# Patient Record
Sex: Female | Born: 1995 | Race: Black or African American | Hispanic: No | Marital: Single | Smoking: Current every day smoker
Health system: Southern US, Community
[De-identification: ages and names within clinical notes are randomized; demographics above are authoritative.]

## PROBLEM LIST (undated history)

## (undated) DIAGNOSIS — J45909 Unspecified asthma, uncomplicated: Secondary | ICD-10-CM

## (undated) DIAGNOSIS — R51 Headache: Secondary | ICD-10-CM

## (undated) DIAGNOSIS — L732 Hidradenitis suppurativa: Secondary | ICD-10-CM

---
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
## 2015-01-08 ENCOUNTER — Emergency Department (INDEPENDENT_AMBULATORY_CARE_PROVIDER_SITE_OTHER): Payer: Medicaid Other

## 2015-01-08 ENCOUNTER — Emergency Department (INDEPENDENT_AMBULATORY_CARE_PROVIDER_SITE_OTHER)
Admission: EM | Admit: 2015-01-08 | Discharge: 2015-01-08 | Disposition: A | Discharge status: Home / Self Care | Payer: Medicaid Other | Source: Home / Self Care | Attending: Family Medicine | Admitting: Family Medicine

## 2015-01-08 DIAGNOSIS — R52 Pain, unspecified: Secondary | ICD-10-CM

## 2015-01-08 DIAGNOSIS — S62609A Fracture of unspecified phalanx of unspecified finger, initial encounter for closed fracture: Secondary | ICD-10-CM

## 2015-01-08 NOTE — Discharge Instructions (Signed)

## 2015-01-08 NOTE — ED Provider Notes (Signed)
[name]: [id number]     Arrival date & time 01/08/15  2683 History   First MD Initiated Contact with Patient 01/08/15 1026     Chief Complaint  Patient presents with  . Hand Pain   (Consider location/radiation/quality/duration/timing/severity/associated sxs/prior Treatment) Patient is a 19 y.o. female presenting with hand pain. The history is provided by the patient. No language interpreter was used.  Hand Pain This is a new problem. The current episode started more than 1 week ago. The problem occurs constantly. The problem has not changed since onset.Nothing aggravates the symptoms. Nothing relieves the symptoms. She has tried nothing for the symptoms. The treatment provided no relief.  Pt injured his finger 2 weeks ago.   Pt complains of continued swelling and pain  Past Medical History  Diagnosis Date  . Asthma Jan 2013    no occurences since then  . Headache(784.0)     associated with menses  . 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 Incision and drainage abscess Bilateral 05/10/2013    Procedure: INCISION AND DRAINAGE of bilateral axillary hydradenitis;  Surgeon: Leighton Ruff, MD;  Location: [facility];  Service: General;  Laterality: Bilateral;   Family History  Problem Relation Age of Onset  . Cancer Maternal Aunt     Breast. diagnosed young  . Cancer Maternal Grandmother     Breast  . Lupus Brother    History  Substance Use Topics  . Smoking status: Former Smoker    Quit date: 05/02/2013  . Smokeless tobacco: Not on file     Comment: mom smokes  . Alcohol Use: No   OB History    No data available     Review of Systems  Musculoskeletal: Positive for joint swelling.  All other systems reviewed and are negative.   Allergies  Ibuprofen  Home Medications   Prior to Admission medications   Medication Sig Start Date End Date Taking? Authorizing Provider  acetaminophen (TYLENOL) 500 MG tablet Take 1,000 mg by mouth every 6  (six) hours as needed. For fever    Historical Provider, MD  albuterol (PROVENTIL HFA;VENTOLIN HFA) 108 (90 BASE) MCG/ACT inhaler Inhale 2 puffs into the lungs every 6 (six) hours as needed for wheezing.    Historical Provider, MD  ondansetron (ZOFRAN ODT) 4 MG disintegrating tablet Take 1 tablet (4 mg total) by mouth every 8 (eight) hours as needed for nausea or vomiting. 09/23/14   Leonard Schwartz, MD  traMADol (ULTRAM) 50 MG tablet Take 1 tablet (50 mg total) by mouth every 6 (six) hours as needed. Patient taking differently: Take 50 mg by mouth every 6 (six) hours as needed for moderate pain.  02/21/14   Domenic Moras, PA-C   BP 112/78 mmHg  Pulse 65  Temp(Src) 98.5 F (36.9 C) (Oral)  Resp 16  SpO2 100%  LMP 12/25/2014 Physical Exam  Constitutional: She is oriented to person, place, and time. She appears well-developed.  Neurological: She is alert and oriented to person, place, and time. She has normal reflexes.  Skin: Skin is warm.  Psychiatric: She has a normal mood and affect.    ED Course  Procedures (including critical care time) Labs Review Labs Reviewed - No data to display  Imaging Review Dg Finger Middle Right  01/08/2015   CLINICAL DATA:  Right middle finger injury while playing ball. Swelling and pain. Initial encounter.  EXAM: RIGHT MIDDLE FINGER 2+V  COMPARISON:  None.  FINDINGS: A nondisplaced fracture  is suspected on the lateral radiograph involving the volar base of the long finger distal phalanx without definite intra-articular extension. There is no other evidence of acute fracture. There is no dislocation. Joint space widths are preserved. Soft tissue swelling is noted diffusely in the long finger. No radiopaque foreign body.  IMPRESSION: Suspected nondisplaced fracture of the long finger distal phalanx.   Electronically Signed   By: Logan Bores   On: 01/08/2015 11:25     MDM Pt placed in a splint,  I advised follow up with Dr. Marga Hoots for evaluation   1. Fracture,  finger, closed, initial encounter   2. Pain    Splint See Dr. Caralyn Guile for recheck in 1 week    Fransico Meadow, PA-C 01/08/15 1139

## 2015-01-08 NOTE — ED Notes (Signed)
C/o right middle finger swelling States finger is painful States she was playing basketball when her finger was jab against another person hand Patient did wrap finger in a splint as tx

## 2015-01-24 ENCOUNTER — Encounter (HOSPITAL_COMMUNITY): Payer: Self-pay

## 2015-01-24 ENCOUNTER — Emergency Department (INDEPENDENT_AMBULATORY_CARE_PROVIDER_SITE_OTHER)
Admission: EM | Admit: 2015-01-24 | Discharge: 2015-01-24 | Disposition: A | Discharge status: Home / Self Care | Payer: Medicaid Other | Source: Home / Self Care | Attending: Family Medicine | Admitting: Family Medicine

## 2015-01-24 ENCOUNTER — Ambulatory Visit (HOSPITAL_COMMUNITY): Payer: Medicaid Other | Attending: Family Medicine

## 2015-01-24 DIAGNOSIS — S43002A Unspecified subluxation of left shoulder joint, initial encounter: Secondary | ICD-10-CM | POA: Diagnosis not present

## 2015-01-24 DIAGNOSIS — M25512 Pain in left shoulder: Secondary | ICD-10-CM | POA: Insufficient documentation

## 2015-01-24 DIAGNOSIS — W19XXXA Unspecified fall, initial encounter: Secondary | ICD-10-CM | POA: Diagnosis not present

## 2015-01-24 MED ORDER — TRAMADOL HCL 50 MG PO TABS: 50.0000 mg | ORAL_TABLET | Freq: Four times a day (QID) | ORAL | Status: DC | PRN
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
## 2015-01-24 NOTE — Discharge Instructions (Signed)
Ice pack and wear sling for comfort , see orthopedist next week if further problems.

## 2015-03-01 ENCOUNTER — Emergency Department (HOSPITAL_COMMUNITY)
Admission: EM | Admit: 2015-03-01 | Discharge: 2015-03-02 | Disposition: A | Discharge status: Home / Self Care | Payer: Medicaid Other | Attending: Emergency Medicine | Admitting: Emergency Medicine

## 2015-03-01 ENCOUNTER — Emergency Department (HOSPITAL_COMMUNITY): Payer: Medicaid Other

## 2015-03-01 ENCOUNTER — Encounter (HOSPITAL_COMMUNITY): Payer: Self-pay | Admitting: Emergency Medicine

## 2015-03-01 DIAGNOSIS — Z87891 Personal history of nicotine dependence: Secondary | ICD-10-CM | POA: Diagnosis not present

## 2015-03-01 DIAGNOSIS — Z872 Personal history of diseases of the skin and subcutaneous tissue: Secondary | ICD-10-CM | POA: Diagnosis not present

## 2015-03-01 DIAGNOSIS — S8252XA Displaced fracture of medial malleolus of left tibia, initial encounter for closed fracture: Secondary | ICD-10-CM | POA: Diagnosis not present

## 2015-03-01 DIAGNOSIS — Y9289 Other specified places as the place of occurrence of the external cause: Secondary | ICD-10-CM | POA: Diagnosis not present

## 2015-03-01 DIAGNOSIS — J45909 Unspecified asthma, uncomplicated: Secondary | ICD-10-CM | POA: Diagnosis not present

## 2015-03-01 DIAGNOSIS — Y9389 Activity, other specified: Secondary | ICD-10-CM | POA: Insufficient documentation

## 2015-03-01 DIAGNOSIS — Y998 Other external cause status: Secondary | ICD-10-CM | POA: Insufficient documentation

## 2015-03-01 DIAGNOSIS — S99912A Unspecified injury of left ankle, initial encounter: Secondary | ICD-10-CM | POA: Diagnosis present

## 2015-03-01 DIAGNOSIS — Z7951 Long term (current) use of inhaled steroids: Secondary | ICD-10-CM | POA: Diagnosis not present

## 2015-03-01 DIAGNOSIS — X58XXXA Exposure to other specified factors, initial encounter: Secondary | ICD-10-CM | POA: Diagnosis not present

## 2015-03-01 MED ORDER — KETOROLAC TROMETHAMINE 60 MG/2ML IM SOLN
60.0000 mg | Freq: Once | INTRAMUSCULAR | Status: AC
  Administered 2015-03-01: 60 mg via INTRAMUSCULAR
  Filled 2015-03-01: qty 2

## 2015-03-01 NOTE — ED Notes (Signed)
Pt reports twisting L ankle 3 weeks ago. Pt c.o continued pain. Ankle brace in place. PMS intact.

## 2015-03-01 NOTE — ED Provider Notes (Signed)
[name]: [id number]     Arrival date & time 03/01/15  2140 History  This chart was scribed for non-physician practitioner, Al Corpus, PA-C,working with Wandra Arthurs, MD, by Marlowe Kays, ED Scribe. This patient was seen in room TR05C/TR05C and the patient's care was started at 11:15 PM.  Chief Complaint  Patient presents with  . Ankle Pain   The history is provided by medical records and the patient. No language interpreter was used.    HPI Comments:  Jessica Fitzpatrick is a 19 y.o. female who presents to the Emergency Department complaining of moderate left ankle pain that began approximately three weeks ago secondary to twisting it. She states the pain has decreased since the injury but is still present. She has been wearing an ankle brace with some relief of the pain. She denies modifying factors of the pain. She states she has been busy and has not been able to stay off of the ankle to allow it to heal. Denies fever, chills, numbness, tingling or weakness of the left ankle or foot.  Past Medical History  Diagnosis Date  . Asthma Jan 2013    no occurences since then  . Headache(784.0)     associated with menses  . 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 Incision and drainage abscess Bilateral 05/10/2013    Procedure: INCISION AND DRAINAGE of bilateral axillary hydradenitis;  Surgeon: Leighton Ruff, MD;  Location: [facility];  Service: General;  Laterality: Bilateral;   Family History  Problem Relation Age of Onset  . Cancer Maternal Aunt     Breast. diagnosed young  . Cancer Maternal Grandmother     Breast  . Lupus Brother    History  Substance Use Topics  . Smoking status: Former Smoker    Quit date: 05/02/2013  . Smokeless tobacco: Not on file     Comment: mom smokes  . Alcohol Use: No   OB History    No data available     Review of Systems  Musculoskeletal: Positive for arthralgias. Negative for joint swelling.  Skin:  Negative for color change and wound.  Neurological: Negative for weakness and numbness.    Allergies  Ibuprofen  Home Medications   Prior to Admission medications   Medication Sig Start Date End Date Taking? Authorizing Provider  acetaminophen (TYLENOL) 500 MG tablet Take 1,000 mg by mouth every 6 (six) hours as needed. For fever    Historical Provider, MD  albuterol (PROVENTIL HFA;VENTOLIN HFA) 108 (90 BASE) MCG/ACT inhaler Inhale 2 puffs into the lungs every 6 (six) hours as needed for wheezing.    Historical Provider, MD  ondansetron (ZOFRAN ODT) 4 MG disintegrating tablet Take 1 tablet (4 mg total) by mouth every 8 (eight) hours as needed for nausea or vomiting. 09/23/14   Leonard Schwartz, MD  traMADol (ULTRAM) 50 MG tablet Take 1 tablet (50 mg total) by mouth every 6 (six) hours as needed. 01/24/15   Billy Fischer, MD   Triage Vitals: BP 116/79 mmHg  Pulse 77  Temp(Src) 98.4 F (36.9 C) (Oral)  Resp 18  SpO2 100%  LMP 02/22/2015 Physical Exam  Constitutional: She appears well-developed and well-nourished. No distress.  HENT:  Head: Normocephalic and atraumatic.  Eyes: Conjunctivae are normal. Right eye exhibits no discharge. Left eye exhibits no discharge.  Cardiovascular:  Pulses:      Dorsalis pedis pulses are 2+ on the right side, and 2+ on the left side.  Posterior tibial pulses are 2+ on the right side, and 2+ on the left side.  Pulmonary/Chest: Effort normal. No respiratory distress.  Musculoskeletal: She exhibits tenderness.  Achilles tendon intact. Normal Thompson's sign. No proximal fibular head tenderness. No medial or malleolar tenderness. No erythema, bruising or wounds. Anterior left joint line tenderness.  Neurological: She is alert. Coordination normal.  5/5 strength in LLE. Sensations intact.  Skin: She is not diaphoretic.  Psychiatric: She has a normal mood and affect. Her behavior is normal.  Nursing note and vitals reviewed.   ED Course  Procedures  (including critical care time) DIAGNOSTIC STUDIES: Oxygen Saturation is 100% on RA, normal by my interpretation.   COORDINATION OF CARE: 11:19 PM- Will X-Ray left ankle and order Toradol. Pt verbalizes understanding and agrees to plan.  Medications  ketorolac (TORADOL) injection 60 mg (60 mg Intramuscular Given 03/01/15 2349)    Labs Review Labs Reviewed - No data to display  Imaging Review Dg Ankle Complete Left  03/02/2015   CLINICAL DATA:  19 year old female with twisting injury playing basketball 3 weeks ago. Continued lateral and anterior pain. Initial encounter.  EXAM: LEFT ANKLE COMPLETE - 3+ VIEW  COMPARISON:  None.  FINDINGS: Anterior soft tissue swelling. No joint effusion is evident. Mortise joint alignment preserved. Talar dome intact. Calcaneus intact. Mild cortical irregularity at the tip of the medial malleolus. Otherwise no acute fracture or dislocation identified. Bone mineralization is within normal limits.  IMPRESSION: 1. Subacute versus chronic avulsion injury from the medial malleolus. 2. No other osseous abnormality identified at the left ankle. Anterior soft tissue swelling.   Electronically Signed   By: Genevie Ann M.D.   On: 03/02/2015 00:05     EKG Interpretation None      MDM   Final diagnoses:  Medial malleolar fracture, left, closed, initial encounter   Subacute versus chronic avulsion injury to medial malleolus. Neurovascularly intact. Patient is 3 weeks out from injury. Patient placed in cam discussed Rice as well as ibuprofen use and follow-up with orthopedics.  Discussed return precautions with patient. Discussed all results and patient verbalizes understanding and agrees with plan.  I personally performed the services described in this documentation, which was scribed in my presence. The recorded information has been reviewed and is accurate.    Al Corpus, PA-C 03/02/15 0025  Wandra Arthurs, MD 03/03/15 1430

## 2015-03-01 NOTE — ED Notes (Signed)
Patient transported to X-ray 

## 2015-03-02 NOTE — Progress Notes (Signed)
Orthopedic Tech Progress Note Patient Details:  Jessica Fitzpatrick [birth year] [id number]  Ortho Devices Type of Ortho Device: CAM walker Ortho Device/Splint Interventions: Application   [name], [name] 03/02/2015, 1:55 AM

## 2015-03-02 NOTE — Discharge Instructions (Signed)
Return to the emergency room with worsening of symptoms, new symptoms or with symptoms that are concerning, especially fevers, redness, swelling, numbness, tingling, weakness, blue toes. Make follow-up appointment with orthopedics. Where Cam Walker. RICE: Rest, Ice (three cycles of 20 mins on, 30mins off at least twice a day), compression/brace, elevation. Heating pad works well for back pain. Ibuprofen 400mg  (2 tablets 200mg ) every 5-6 hours for 3-5 days. Read below information and follow recommendations. Ankle Fracture A fracture is a break in a bone. The ankle joint is made up of three bones. These include the lower (distal)sections of your lower leg bones, called the tibia and fibula, along with a bone in your foot, called the talus. Depending on how bad the break is and if more than one ankle joint bone is broken, a cast or splint is used to protect and keep your injured bone from moving while it heals. Sometimes, surgery is required to help the fracture heal properly.  There are two general types of fractures:  Stable fracture. This includes a single fracture line through one bone, with no injury to ankle ligaments. A fracture of the talus that does not have any displacement (movement of the bone on either side of the fracture line) is also stable.  Unstable fracture. This includes more than one fracture line through one or more bones in the ankle joint. It also includes fractures that have displacement of the bone on either side of the fracture line. CAUSES  A direct blow to the ankle.   Quickly and severely twisting your ankle.  Trauma, such as a car accident or falling from a significant height. RISK FACTORS You may be at a higher risk of ankle fracture if:  You have certain medical conditions.  You are involved in high-impact sports.  You are involved in a high-impact car accident. SIGNS AND SYMPTOMS   Tender and swollen ankle.  Bruising around the injured ankle.  Pain on  movement of the ankle.  Difficulty walking or putting weight on the ankle.  A cold foot below the site of the ankle injury. This can occur if the blood vessels passing through your injured ankle were also damaged.  Numbness in the foot below the site of the ankle injury. DIAGNOSIS  An ankle fracture is usually diagnosed with a physical exam and X-rays. A CT scan may also be required for complex fractures. TREATMENT  Stable fractures are treated with a cast or splint and using crutches to avoid putting weight on your injured ankle. This is followed by an ankle strengthening program. Some patients require a special type of cast, depending on other medical problems they may have. Unstable fractures require surgery to ensure the bones heal properly. Your health care provider will tell you what type of fracture you have and the best treatment for your condition. HOME CARE INSTRUCTIONS   Review correct crutch use with your health care provider and use your crutches as directed. Safe use of crutches is extremely important. Misuse of crutches can cause you to fall or cause injury to nerves in your hands or armpits.  Do not put weight or pressure on the injured ankle until directed by your health care provider.  To lessen the swelling, keep the injured leg elevated while sitting or lying down.  Apply ice to the injured area:  Put ice in a plastic bag.  Place a towel between your cast and the bag.  Leave the ice on for 20 minutes, 2-3 times a day.  If you have a plaster or fiberglass cast:  Do not try to scratch the skin under the cast with any objects. This can increase your risk of skin infection.  Check the skin around the cast every day. You may put lotion on any red or sore areas.  Keep your cast dry and clean.  If you have a plaster splint:  Wear the splint as directed.  You may loosen the elastic around the splint if your toes become numb, tingle, or turn cold or blue.  Do not  put pressure on any part of your cast or splint; it may break. Rest your cast only on a pillow the first 24 hours until it is fully hardened.  Your cast or splint can be protected during bathing with a plastic bag sealed to your skin with medical tape. Do not lower the cast or splint into water.  Take medicines as directed by your health care provider. Only take over-the-counter or prescription medicines for pain, discomfort, or fever as directed by your health care provider.  Do not drive a vehicle until your health care provider specifically tells you it is safe to do so.  If your health care provider has given you a follow-up appointment, it is very important to keep that appointment. Not keeping the appointment could result in a chronic or permanent injury, pain, and disability. If you have any problem keeping the appointment, call the facility for assistance. SEEK MEDICAL CARE IF: You develop increased swelling or discomfort. SEEK IMMEDIATE MEDICAL CARE IF:   Your cast gets damaged or breaks.  You have continued severe pain.  You develop new pain or swelling after the cast was put on.  Your skin or toenails below the injury turn blue or gray.  Your skin or toenails below the injury feel cold, numb, or have loss of sensitivity to touch.  There is a bad smell or pus draining from under the cast. MAKE SURE YOU:   Understand these instructions.  Will watch your condition.  Will get help right away if you are not doing well or get worse. Document Released: 08/27/2000 Document Revised: 09/04/2013 Document Reviewed: 03/29/2013 [facility] Patient Information 2015 [place], [place]. This information is not intended to replace advice given to you by your health care provider. Make sure you discuss any questions you have with your health care provider.

## 2015-03-30 ENCOUNTER — Emergency Department (HOSPITAL_COMMUNITY): Payer: Medicaid Other

## 2015-03-30 ENCOUNTER — Encounter (HOSPITAL_COMMUNITY): Payer: Self-pay | Admitting: *Deleted

## 2015-03-30 ENCOUNTER — Emergency Department (HOSPITAL_COMMUNITY)
Admission: EM | Admit: 2015-03-30 | Discharge: 2015-03-30 | Disposition: A | Discharge status: Home / Self Care | Payer: Medicaid Other | Attending: Emergency Medicine | Admitting: Emergency Medicine

## 2015-03-30 DIAGNOSIS — Z8781 Personal history of (healed) traumatic fracture: Secondary | ICD-10-CM | POA: Insufficient documentation

## 2015-03-30 DIAGNOSIS — X58XXXD Exposure to other specified factors, subsequent encounter: Secondary | ICD-10-CM | POA: Insufficient documentation

## 2015-03-30 DIAGNOSIS — M25572 Pain in left ankle and joints of left foot: Secondary | ICD-10-CM | POA: Diagnosis present

## 2015-03-30 DIAGNOSIS — Z872 Personal history of diseases of the skin and subcutaneous tissue: Secondary | ICD-10-CM | POA: Diagnosis not present

## 2015-03-30 DIAGNOSIS — S93402D Sprain of unspecified ligament of left ankle, subsequent encounter: Secondary | ICD-10-CM | POA: Diagnosis not present

## 2015-03-30 DIAGNOSIS — J45909 Unspecified asthma, uncomplicated: Secondary | ICD-10-CM | POA: Diagnosis not present

## 2015-03-30 DIAGNOSIS — Z87891 Personal history of nicotine dependence: Secondary | ICD-10-CM | POA: Diagnosis not present

## 2015-03-30 NOTE — ED Notes (Signed)
Patient is alert and orientedx4.  Patient was explained discharge instructions and they understood them with no questions.  The patient's step mother, Tessie Fass is taking the patient home.

## 2015-03-30 NOTE — Discharge Instructions (Signed)
Ankle Sprain °An ankle sprain is an injury to the strong, fibrous tissues (ligaments) that hold the bones of your ankle joint together.  °CAUSES °An ankle sprain is usually caused by a fall or by twisting your ankle. Ankle sprains most commonly occur when you step on the outer edge of your foot, and your ankle turns inward. People who participate in sports are more prone to these types of injuries.  °SYMPTOMS  °· Pain in your ankle. The pain may be present at rest or only when you are trying to stand or walk. °· Swelling. °· Bruising. Bruising may develop immediately or within 1 to 2 days after your injury. °· Difficulty standing or walking, particularly when turning corners or changing directions. °DIAGNOSIS  °Your caregiver will ask you details about your injury and perform a physical exam of your ankle to determine if you have an ankle sprain. During the physical exam, your caregiver will press on and apply pressure to specific areas of your foot and ankle. Your caregiver will try to move your ankle in certain ways. An X-ray exam may be done to be sure a bone was not broken or a ligament did not separate from one of the bones in your ankle (avulsion fracture).  °TREATMENT  °Certain types of braces can help stabilize your ankle. Your caregiver can make a recommendation for this. Your caregiver may recommend the use of medicine for pain. If your sprain is severe, your caregiver may refer you to a surgeon who helps to restore function to parts of your skeletal system (orthopedist) or a physical therapist. °HOME CARE INSTRUCTIONS  °· Apply ice to your injury for 1-2 days or as directed by your caregiver. Applying ice helps to reduce inflammation and pain. °· Put ice in a plastic bag. °· Place a towel between your skin and the bag. °· Leave the ice on for 15-20 minutes at a time, every 2 hours while you are awake. °· Only take over-the-counter or prescription medicines for pain, discomfort, or fever as directed by  your caregiver. °· Elevate your injured ankle above the level of your heart as much as possible for 2-3 days. °· If your caregiver recommends crutches, use them as instructed. Gradually put weight on the affected ankle. Continue to use crutches or a cane until you can walk without feeling pain in your ankle. °· If you have a plaster splint, wear the splint as directed by your caregiver. Do not rest it on anything harder than a pillow for the first 24 hours. Do not put weight on it. Do not get it wet. You may take it off to take a shower or bath. °· You may have been given an elastic bandage to wear around your ankle to provide support. If the elastic bandage is too tight (you have numbness or tingling in your foot or your foot becomes cold and blue), adjust the bandage to make it comfortable. °· If you have an air splint, you may blow more air into it or let air out to make it more comfortable. You may take your splint off at night and before taking a shower or bath. Wiggle your toes in the splint several times per day to decrease swelling. °SEEK MEDICAL CARE IF:  °· You have rapidly increasing bruising or swelling. °· Your toes feel extremely cold or you lose feeling in your foot. °· Your pain is not relieved with medicine. °SEEK IMMEDIATE MEDICAL CARE IF: °· Your toes are numb or blue. °·   You have severe pain that is increasing. °MAKE SURE YOU:  °· Understand these instructions. °· Will watch your condition. °· Will get help right away if you are not doing well or get worse. °Document Released: 08/30/2005 Document Revised: 05/24/2012 Document Reviewed: 09/11/2011 °[employer]® Patient Information ©2015 [employer]. This information is not intended to replace advice given to you by your health care provider. Make sure you discuss any questions you have with your health care provider. ° ° ° °RICE: Routine Care for Injuries °The routine care of many injuries includes Rest, Ice, Compression, and Elevation (RICE). °HOME  CARE INSTRUCTIONS °· Rest is needed to allow your body to heal. Routine activities can usually be resumed when comfortable. Injured tendons and bones can take up to 6 weeks to heal. Tendons are the cord-like structures that attach muscle to bone. °· Ice following an injury helps keep the swelling down and reduces pain. °¨ Put ice in a plastic bag. °¨ Place a towel between your skin and the bag. °¨ Leave the ice on for 15-20 minutes, 3-4 times a day, or as directed by your health care provider. Do this while awake, for the first 24 to 48 hours. After that, continue as directed by your caregiver. °· Compression helps keep swelling down. It also gives support and helps with discomfort. If an elastic bandage has been applied, it should be removed and reapplied every 3 to 4 hours. It should not be applied tightly, but firmly enough to keep swelling down. Watch fingers or toes for swelling, bluish discoloration, coldness, numbness, or excessive pain. If any of these problems occur, remove the bandage and reapply loosely. Contact your caregiver if these problems continue. °· Elevation helps reduce swelling and decreases pain. With extremities, such as the arms, hands, legs, and feet, the injured area should be placed near or above the level of the heart, if possible. °SEEK IMMEDIATE MEDICAL CARE IF: °· You have persistent pain and swelling. °· You develop redness, numbness, or unexpected weakness. °· Your symptoms are getting worse rather than improving after several days. °These symptoms may indicate that further evaluation or further X-rays are needed. Sometimes, X-rays may not show a small broken bone (fracture) until 1 week or 10 days later. Make a follow-up appointment with your caregiver. Ask when your X-ray results will be ready. Make sure you get your X-ray results. °Document Released: 12/12/2000 Document Revised: 09/04/2013 Document Reviewed: 01/29/2011 °[employer]® Patient Information ©2015 [employer]. This  information is not intended to replace advice given to you by your health care provider. Make sure you discuss any questions you have with your health care provider. ° °

## 2015-03-30 NOTE — ED Provider Notes (Signed)
[name]: [id number]     Arrival date & time 03/30/15  7616 History  This chart was scribed for Lucien Mons, PA-C, working with Leonard Schwartz, MD by Steva Colder, ED Scribe. The patient was seen in room TR05C/TR05C at 10:54 AM.       Chief Complaint  Patient presents with  . Ankle Pain      The history is provided by the patient. No language interpreter was used.    HPI Comments: Jessica Fitzpatrick is a 19 y.o. female who presents to the Emergency Department complaining of ankle pain onset 1 months ago. Pt reports that she was seen in the ED on 03/01/15 and was dx with an ankle fracture and was informed to f/u with orthopedics. Pt reports that she is not able to get an appointment with the orthopedic because she needs a referral from her PCP who she is not able to see until september. Pt reports that now her pain is all around the ankle, 8/10. She states that she has not tried any medications for the relief for her symptoms. She denies color change, wound, rash, joint swelling, and any other symptoms. Pt is allergic to ibuprofen and she will itch when she takes it. Pt denies ever taking benadryl with the ibuprofen in the past.   Past Medical History  Diagnosis Date  . Asthma Jan 2013    no occurences since then  . Headache(784.0)     associated with menses  . 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 Incision and drainage abscess Bilateral 05/10/2013    Procedure: INCISION AND DRAINAGE of bilateral axillary hydradenitis;  Surgeon: Leighton Ruff, MD;  Location: [facility];  Service: General;  Laterality: Bilateral;   Family History  Problem Relation Age of Onset  . Cancer Maternal Aunt     Breast. diagnosed young  . Cancer Maternal Grandmother     Breast  . Lupus Brother    History  Substance Use Topics  . Smoking status: Former Smoker    Quit date: 05/02/2013  . Smokeless tobacco: Not on file     Comment: mom smokes  . Alcohol Use: No   OB  History    No data available     Review of Systems  Musculoskeletal: Positive for arthralgias. Negative for joint swelling.  Skin: Negative for color change, rash and wound.      Allergies  Ibuprofen  Home Medications   Prior to Admission medications   Medication Sig Start Date End Date Taking? Authorizing Provider  acetaminophen (TYLENOL) 500 MG tablet Take 1,000 mg by mouth every 6 (six) hours as needed. For fever    Historical Provider, MD  albuterol (PROVENTIL HFA;VENTOLIN HFA) 108 (90 BASE) MCG/ACT inhaler Inhale 2 puffs into the lungs every 6 (six) hours as needed for wheezing.    Historical Provider, MD  ondansetron (ZOFRAN ODT) 4 MG disintegrating tablet Take 1 tablet (4 mg total) by mouth every 8 (eight) hours as needed for nausea or vomiting. 09/23/14   Leonard Schwartz, MD  traMADol (ULTRAM) 50 MG tablet Take 1 tablet (50 mg total) by mouth every 6 (six) hours as needed. 01/24/15   Billy Fischer, MD   BP 114/75 mmHg  Pulse 93  Temp(Src) 98.4 F (36.9 C) (Oral)  Resp 18  SpO2 100%  LMP 03/23/2015 Physical Exam  Constitutional: She is oriented to person, place, and time. She appears well-developed and well-nourished. No distress.  HENT:  Head:  Normocephalic and atraumatic.  Eyes: EOM are normal.  Neck: Neck supple. No tracheal deviation present.  Cardiovascular: Normal rate.   Pulses:      Dorsalis pedis pulses are 2+ on the left side.       Posterior tibial pulses are 2+ on the left side.  Pulmonary/Chest: Effort normal. No respiratory distress.  Musculoskeletal: Normal range of motion.  Left ankle TTP over AITFL with no swelling. FROM with pain. +2 pt/dp pulse.  Neurological: She is alert and oriented to person, place, and time.  Skin: Skin is warm and dry.  Psychiatric: She has a normal mood and affect. Her behavior is normal.  Nursing note and vitals reviewed.   ED Course  Procedures (including critical care time) DIAGNOSTIC STUDIES: Oxygen Saturation is  100% on RA, nl by my interpretation.    COORDINATION OF CARE: 10:56 AM-Discussed treatment plan which includes RICE, f/u with orthopedist, with pt at bedside and pt agreed to plan.   Labs Review Labs Reviewed - No data to display  Imaging Review Dg Ankle Complete Left  03/30/2015   CLINICAL DATA:  Ankle pain for 1 month, history of previous fracture  EXAM: LEFT ANKLE COMPLETE - 3+ VIEW  COMPARISON:  03/01/2015  FINDINGS: Three views of left ankle submitted. No acute fracture or subluxation. Ankle mortise is preserved. Stable tiny chronic avulsed bony fragment adjacent to medial malleolus which is well corticated.  IMPRESSION: Negative.   Electronically Signed   By: Lahoma Crocker M.D.   On: 03/30/2015 10:50     EKG Interpretation None      MDM   Final diagnoses:  Left ankle sprain, subsequent encounter   NAD. Neurovascularly intact. No swelling or deformity. X-ray negative. Advised rice, Tylenol. States ibuprofen makes her itchy. Follow-up with orthopedics. Stable for discharge. Return precautions given. Patient states understanding of treatment care plan and is agreeable.  I personally performed the services described in this documentation, which was scribed in my presence. The recorded information has been reviewed and considered.   Carman Ching, PA-C 03/30/15 1101  Leonard Schwartz, MD 03/31/15 [phone]

## 2015-03-30 NOTE — ED Notes (Signed)
Pt reports hx of ankle fx one month ago, still having pain. Pt unable to get dr appt until sept.

## 2015-04-17 ENCOUNTER — Encounter (HOSPITAL_COMMUNITY): Payer: Self-pay | Admitting: Vascular Surgery

## 2015-04-17 ENCOUNTER — Emergency Department (HOSPITAL_COMMUNITY)
Admission: EM | Admit: 2015-04-17 | Discharge: 2015-04-17 | Disposition: A | Discharge status: Home / Self Care | Payer: Medicaid Other | Attending: Emergency Medicine | Admitting: Emergency Medicine

## 2015-04-17 DIAGNOSIS — Z872 Personal history of diseases of the skin and subcutaneous tissue: Secondary | ICD-10-CM | POA: Diagnosis not present

## 2015-04-17 DIAGNOSIS — Z87891 Personal history of nicotine dependence: Secondary | ICD-10-CM | POA: Insufficient documentation

## 2015-04-17 DIAGNOSIS — R05 Cough: Secondary | ICD-10-CM | POA: Diagnosis not present

## 2015-04-17 DIAGNOSIS — J45909 Unspecified asthma, uncomplicated: Secondary | ICD-10-CM | POA: Insufficient documentation

## 2015-04-17 DIAGNOSIS — J029 Acute pharyngitis, unspecified: Secondary | ICD-10-CM | POA: Insufficient documentation

## 2015-04-17 DIAGNOSIS — J3489 Other specified disorders of nose and nasal sinuses: Secondary | ICD-10-CM | POA: Diagnosis not present

## 2015-04-17 LAB — RAPID STREP SCREEN (MED CTR MEBANE ONLY): Streptococcus, Group A Screen (Direct): NEGATIVE

## 2015-04-17 MED ORDER — PHENOL 1.4 % MT LIQD: 1.0000 | OROMUCOSAL | Status: DC | PRN

## 2015-04-17 NOTE — ED Notes (Signed)
Pt reports to the ED for eval of sore throat that began today. She reports the pain began as soon as she got to work. States her tonsils also feel swollen. Tonsils +2 and slightly erythematous. Pain worse with swallowing. She has also had a runny nose x 3 days. Pt A&Ox4, resp e/u, and skin warm and dry. Airway intact.

## 2015-04-17 NOTE — ED Provider Notes (Signed)
[name]: [id number]     Arrival date & time 04/17/15  1728 History  This chart was scribed for non-physician practitioner, Quincy Carnes, PA-C, working with Ezequiel Essex, MD, by Stephania Fragmin, ED Scribe. This patient was seen in room TR08C/TR08C and the patient's care was started at 6:04 PM.   Chief Complaint  Patient presents with  . Sore Throat   The history is provided by the patient. No language interpreter was used.   HPI Comments: Jessica Fitzpatrick is a 19 y.o. female who presents to the Emergency Department complaining of a constant, moderate sore throat that began today as soon as she got to work. She also complains of a mild cough and rhinorrhea that began 3 days ago. Swallowing exacerbates her pain. She denies a history of strep throat infections. She denies any known fever.  No chest pain or SOB.  VSS.  Past Medical History  Diagnosis Date  . Asthma Jan 2013    no occurences since then  . Headache(784.0)     associated with menses  . 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 Incision and drainage abscess Bilateral 05/10/2013    Procedure: INCISION AND DRAINAGE of bilateral axillary hydradenitis;  Surgeon: Leighton Ruff, MD;  Location: [facility];  Service: General;  Laterality: Bilateral;   Family History  Problem Relation Age of Onset  . Cancer Maternal Aunt     Breast. diagnosed young  . Cancer Maternal Grandmother     Breast  . Lupus Brother    History  Substance Use Topics  . Smoking status: Former Smoker    Quit date: 05/02/2013  . Smokeless tobacco: Not on file     Comment: mom smokes  . Alcohol Use: No   OB History    No data available     Review of Systems  Constitutional: Negative for fever.  HENT: Positive for sore throat. Negative for rhinorrhea.   Respiratory: Negative for cough.   All other systems reviewed and are negative.  Allergies  Ibuprofen  Home Medications   Prior to Admission medications    Medication Sig Start Date End Date Taking? Authorizing Provider  acetaminophen (TYLENOL) 500 MG tablet Take 1,000 mg by mouth every 6 (six) hours as needed. For fever    Historical Provider, MD  albuterol (PROVENTIL HFA;VENTOLIN HFA) 108 (90 BASE) MCG/ACT inhaler Inhale 2 puffs into the lungs every 6 (six) hours as needed for wheezing.    Historical Provider, MD  ondansetron (ZOFRAN ODT) 4 MG disintegrating tablet Take 1 tablet (4 mg total) by mouth every 8 (eight) hours as needed for nausea or vomiting. 09/23/14   Leonard Schwartz, MD  traMADol (ULTRAM) 50 MG tablet Take 1 tablet (50 mg total) by mouth every 6 (six) hours as needed. 01/24/15   Billy Fischer, MD   BP 119/73 mmHg  Pulse 75  Resp 14  SpO2 100%  LMP 03/23/2015   Physical Exam  Constitutional: She is oriented to person, place, and time. She appears well-developed and well-nourished. No distress.  HENT:  Head: Normocephalic and atraumatic.  Mouth/Throat: Oropharynx is clear and moist.  Tonsils 1+ bilaterally without exudate; uvula midline without peritonsillar abscess; handling secretions appropriately; no difficulty swallowing or speaking  Eyes: Conjunctivae and EOM are normal. Pupils are equal, round, and reactive to light.  Neck: Normal range of motion. Neck supple.  Cardiovascular: Normal rate, regular rhythm and normal heart sounds.   Pulmonary/Chest: Effort normal and breath sounds  normal. No respiratory distress. She has no wheezes.  Musculoskeletal: Normal range of motion. She exhibits no edema.  Neurological: She is alert and oriented to person, place, and time.  Skin: Skin is warm and dry. She is not diaphoretic.  Psychiatric: She has a normal mood and affect.  Nursing note and vitals reviewed.   ED Course  Procedures (including critical care time)  DIAGNOSTIC STUDIES: Oxygen Saturation is 100% on RA, normal by my interpretation.    COORDINATION OF CARE: 6:05 PM - Discussed treatment plan with pt at bedside  which includes strep screen, and pt agreed to plan.   MDM   Final diagnoses:  Sore throat   19 year old female with less than 24 hours of strep throat. Tonsils 1+ bilaterally without exudate. Uvula remains midline without evidence of peritonsillar abscess. Patient handling her secretions well, no difficulty swallowing or speaking. Vital signs are stable. Rapid strep was sent and is negative. Culture pending. Suspect viral etiology of symptoms. Patient discharged home with supportive care.  Discussed plan with patient, he/she acknowledged understanding and agreed with plan of care.  Return precautions given for new or worsening symptoms.  I personally performed the services described in this documentation, which was scribed in my presence. The recorded information has been reviewed and is accurate.  Larene Pickett, PA-C 04/17/15 2008  Ezequiel Essex, MD 04/17/15 [phone]

## 2015-04-17 NOTE — Discharge Instructions (Signed)
Your rapid strep test was negative.  It has been sent for culture-- if abnormal, you will be notified. Take the prescribed medication as directed. Follow-up with your primary care physician. Return to the ED for new or worsening symptoms.

## 2015-04-19 LAB — CULTURE, GROUP A STREP: STREP A CULTURE: NEGATIVE

## 2015-06-12 ENCOUNTER — Inpatient Hospital Stay (HOSPITAL_COMMUNITY)
Admission: AD | Admit: 2015-06-12 | Discharge: 2015-06-12 | Disposition: A | Discharge status: Home / Self Care | Payer: Medicaid Other | Source: Ambulatory Visit | Attending: Obstetrics & Gynecology | Admitting: Obstetrics & Gynecology

## 2015-06-12 ENCOUNTER — Encounter (HOSPITAL_COMMUNITY): Payer: Self-pay | Admitting: *Deleted

## 2015-06-12 DIAGNOSIS — N80129 Deep endometriosis of ovary, unspecified ovary: Secondary | ICD-10-CM

## 2015-06-12 DIAGNOSIS — R1031 Right lower quadrant pain: Secondary | ICD-10-CM

## 2015-06-12 DIAGNOSIS — R102 Pelvic and perineal pain: Secondary | ICD-10-CM | POA: Insufficient documentation

## 2015-06-12 DIAGNOSIS — N946 Dysmenorrhea, unspecified: Secondary | ICD-10-CM | POA: Insufficient documentation

## 2015-06-12 DIAGNOSIS — N803 Endometriosis of pelvic peritoneum: Secondary | ICD-10-CM | POA: Diagnosis not present

## 2015-06-12 DIAGNOSIS — Z87891 Personal history of nicotine dependence: Secondary | ICD-10-CM | POA: Insufficient documentation

## 2015-06-12 DIAGNOSIS — K4090 Unilateral inguinal hernia, without obstruction or gangrene, not specified as recurrent: Secondary | ICD-10-CM | POA: Diagnosis not present

## 2015-06-12 DIAGNOSIS — N809 Endometriosis, unspecified: Secondary | ICD-10-CM | POA: Diagnosis not present

## 2015-06-12 LAB — URINALYSIS, ROUTINE W REFLEX MICROSCOPIC
Bilirubin Urine: NEGATIVE
Glucose, UA: NEGATIVE mg/dL
Hgb urine dipstick: NEGATIVE
KETONES UR: NEGATIVE mg/dL
Leukocytes, UA: NEGATIVE
NITRITE: NEGATIVE
PROTEIN: NEGATIVE mg/dL
Specific Gravity, Urine: <1.005 — ABNORMAL LOW (ref 1.005–1.030)
Urobilinogen, UA: 0.2 mg/dL (ref 0.0–1.0)
pH: 5.5 (ref 5.0–8.0)

## 2015-06-12 LAB — POCT PREGNANCY, URINE: Preg Test, Ur: NEGATIVE

## 2015-06-12 MED ORDER — LEVONORGEST-ETH ESTRAD 91-DAY 0.15-0.03 &0.01 MG PO TABS: 1.0000 | ORAL_TABLET | Freq: Every day | ORAL | Status: DC

## 2015-06-12 MED ORDER — TRAMADOL HCL 50 MG PO TABS: 50.0000 mg | ORAL_TABLET | Freq: Four times a day (QID) | ORAL | Status: DC | PRN

## 2015-06-12 NOTE — MAU Note (Signed)
Pt c/o right groin pain for the past 2 days. Stated she feels a bump underneath the skin. Stated she gets thsi when she i about to have her menstrual cycle.

## 2015-06-12 NOTE — MAU Note (Signed)
Assumed care of patient.

## 2015-06-12 NOTE — MAU Provider Note (Signed)
History     [name]: [id number]  Arrival date and time: 06/12/15 1257   First Provider Initiated Contact with Patient 06/12/15 1355      Chief Complaint  Patient presents with  . Pelvic Pain   HPI   Ms.Jessica Fitzpatrick is a 19 y.o. female [id number] presenting to MAU with concerns about a lump in her groin that keeps popping up. The lump is currently there. The lump hurts when she pushes on it and tends to pop up 1 week before she comes on her menstrual cycle every month. The pain in that area worsens when she is on her menstrual cycle; and recently she started having very painful periods. Her mother is here with her and is concerned about the pain she is having with her menstrual cycles.   The area is painful right now only when pressure is applied. If the area is not touched she rates her pain 0/10  Sexually active with females only- never with males.   OB History    Gravida Para Term Preterm AB TAB SAB Ectopic Multiple Living   0 0 0 0 0 0 0 0 0 0       Past Medical History  Diagnosis Date  . Asthma Jan 2013    no occurences since then  . Headache(784.0)     associated with menses  . 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 Incision and drainage abscess Bilateral 05/10/2013    Procedure: INCISION AND DRAINAGE of bilateral axillary hydradenitis;  Surgeon: Leighton Ruff, MD;  Location: [facility];  Service: General;  Laterality: Bilateral;    Family History  Problem Relation Age of Onset  . Cancer Maternal Aunt     Breast. diagnosed young  . Cancer Maternal Grandmother     Breast  . Lupus Brother     Social History  Substance Use Topics  . Smoking status: Former Smoker    Quit date: 05/02/2013  . Smokeless tobacco: None     Comment: mom smokes  . Alcohol Use: No    Allergies:  Allergies  Allergen Reactions  . Ibuprofen Hives and Itching    Prescriptions prior to admission  Medication Sig Dispense Refill Last Dose  .  acetaminophen (TYLENOL) 500 MG tablet Take 1,000 mg by mouth every 6 (six) hours as needed. For fever   Unknown at Unknown time  . albuterol (PROVENTIL HFA;VENTOLIN HFA) 108 (90 BASE) MCG/ACT inhaler Inhale 2 puffs into the lungs every 6 (six) hours as needed for wheezing.   Unknown at Unknown time  . ondansetron (ZOFRAN ODT) 4 MG disintegrating tablet Take 1 tablet (4 mg total) by mouth every 8 (eight) hours as needed for nausea or vomiting. 6 tablet 0 Unknown at Unknown time  . phenol (CHLORASEPTIC) 1.4 % LIQD Use as directed 1 spray in the mouth or throat as needed for throat irritation / pain. 177 mL 0   . traMADol (ULTRAM) 50 MG tablet Take 1 tablet (50 mg total) by mouth every 6 (six) hours as needed. 15 tablet 0    Results for orders placed or performed during the hospital encounter of 06/12/15 (from the past 48 hour(s))  Urinalysis, Routine w reflex microscopic (not at [facility])     Status: Abnormal   Collection Time: 06/12/15  1:00 PM  Result Value Ref Range   Color, Urine YELLOW YELLOW   APPearance CLEAR CLEAR   Specific Gravity, Urine <1.005 (L) 1.005 - 1.030  pH 5.5 5.0 - 8.0   Glucose, UA NEGATIVE NEGATIVE mg/dL   Hgb urine dipstick NEGATIVE NEGATIVE   Bilirubin Urine NEGATIVE NEGATIVE   Ketones, ur NEGATIVE NEGATIVE mg/dL   Protein, ur NEGATIVE NEGATIVE mg/dL   Urobilinogen, UA 0.2 0.0 - 1.0 mg/dL   Nitrite NEGATIVE NEGATIVE   Leukocytes, UA NEGATIVE NEGATIVE    Comment: MICROSCOPIC NOT DONE ON URINES WITH NEGATIVE PROTEIN, BLOOD, LEUKOCYTES, NITRITE, OR GLUCOSE <1000 mg/dL.  Pregnancy, urine POC     Status: None   Collection Time: 06/12/15  1:20 PM  Result Value Ref Range   Preg Test, Ur NEGATIVE NEGATIVE    Comment:        THE SENSITIVITY OF THIS METHODOLOGY IS >24 mIU/mL     Review of Systems  Constitutional: Negative for fever and chills.  Genitourinary: Negative for dysuria.   Physical Exam   Blood pressure 120/71, pulse 81, temperature 98.8 F (37.1 C),  temperature source Oral, resp. rate 18, height 5\' 3"  (1.6 m), weight 61.598 kg (135 lb 12.8 oz), last menstrual period 05/24/2015.  Physical Exam  Constitutional: She is oriented to person, place, and time. She appears well-developed and well-nourished. No distress.  HENT:  Head: Normocephalic.  Eyes: Pupils are equal, round, and reactive to light.  Neck: Neck supple.  GI: Soft.  Genitourinary:     Neurological: She is alert and oriented to person, place, and time.  Skin: Skin is warm. She is not diaphoretic.  Psychiatric: Her behavior is normal.    MAU Course  Procedures  None  MDM  Dr. Harolyn Rutherford at bedside to examine the patient. Discussed treatment options including BC pills, Depo provera, or General surgeon consult. The patient would like to try [facility] pills.   Assessment and Plan   A:  Inguinal hernia VS. Inguinal endometrioma.    P:  Discharge home in stable condition RX: Seasonique, ultram (patient is allergic to ibuprofen) Follow up with [facility] as needed; call to schedule an appointment Return to MAU if symptoms worsen    Lezlie Lye, NP 06/12/2015 2:02 PM

## 2015-07-17 ENCOUNTER — Emergency Department (HOSPITAL_COMMUNITY)
Admission: EM | Admit: 2015-07-17 | Discharge: 2015-07-17 | Disposition: A | Discharge status: Home / Self Care | Payer: Medicaid Other | Attending: Emergency Medicine | Admitting: Emergency Medicine

## 2015-07-17 ENCOUNTER — Encounter (HOSPITAL_COMMUNITY): Payer: Self-pay | Admitting: *Deleted

## 2015-07-17 DIAGNOSIS — Z793 Long term (current) use of hormonal contraceptives: Secondary | ICD-10-CM | POA: Insufficient documentation

## 2015-07-17 DIAGNOSIS — Z87891 Personal history of nicotine dependence: Secondary | ICD-10-CM | POA: Diagnosis not present

## 2015-07-17 DIAGNOSIS — J45909 Unspecified asthma, uncomplicated: Secondary | ICD-10-CM | POA: Insufficient documentation

## 2015-07-17 DIAGNOSIS — M7752 Other enthesopathy of left foot: Secondary | ICD-10-CM

## 2015-07-17 DIAGNOSIS — M65272 Calcific tendinitis, left ankle and foot: Secondary | ICD-10-CM | POA: Insufficient documentation

## 2015-07-17 DIAGNOSIS — M25572 Pain in left ankle and joints of left foot: Secondary | ICD-10-CM | POA: Diagnosis present

## 2015-07-17 DIAGNOSIS — Z872 Personal history of diseases of the skin and subcutaneous tissue: Secondary | ICD-10-CM | POA: Diagnosis not present

## 2015-07-17 MED ORDER — NAPROXEN 500 MG PO TABS: 500.0000 mg | ORAL_TABLET | Freq: Two times a day (BID) | ORAL | Status: DC

## 2015-07-17 MED ORDER — TRAMADOL HCL 50 MG PO TABS: 50.0000 mg | ORAL_TABLET | Freq: Four times a day (QID) | ORAL | Status: DC | PRN

## 2015-07-17 NOTE — ED Notes (Signed)
Declined W/C at D/C and was escorted to lobby by RN. 

## 2015-07-17 NOTE — ED Notes (Signed)
PT reports  Foot pain started May of 2016. Pt states "I have been to busy to go to the orthopedic doctor."  Pt is wearing a  podus boot on Lt foot at this time.

## 2015-07-17 NOTE — ED Provider Notes (Signed)
[name]: [id number]     Arrival date & time 07/17/15  1053 History  By signing my name below, I, Jessica Fitzpatrick, attest that this documentation has been prepared under the direction and in the presence of Margarita Mail, PA-C.  Electronically Signed: Eustaquio Fitzpatrick, ED Scribe. 07/17/2015. 1:51 PM.   Chief Complaint  Patient presents with  . Foot Pain   The history is provided by the patient. No language interpreter was used.     HPI Comments: Jessica Fitzpatrick is a 19 y.o. female who presents to the Emergency Department complaining of gradual onset, constant, left ankle pain x 7 months, worsening the past 2-3 days. Pt notes that in May 2016 (7 months ago) she began having atraumatic left ankle pain. She was seen in ED on 03/01/2015 (3 weeks after the initial pain began) for her symptoms. Pt had x ray done at that time with findings of subacute versus chronic avulsion injury from the medial malleolus. Pt was placed in cam walker at that time and told to follow up with orthopedist. She came back to the ED on 03/29/2015 for worsening pain after she could not follow up with orthopedist because she needed a referral from her PCP and would not be able to see them until September 2016. Pt had x ray done at that time which found stable tiny chronic avulsed bony fragment adjacent to medial malleolus which is well corticated. Pt reports that since then she has been wearing her boot but took it off 2-3 weeks ago because she was no longer having pain. She states that 2-3 days ago she began having pain again. Today she was unable to bear weight on the foot, prompting her to return to the ED. She notes that she has been unable to take time off of work to follow up with the orthopedist. Pt works at [employer] and is on her feet most of the day. Pt has not taken anything for the pain. Denies numbness, weakness, tingling, or any other associated symptoms.   Past Medical History  Diagnosis Date  . Asthma Jan 2013    no  occurences since then  . Headache(784.0)     associated with menses  . 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 Incision and drainage abscess Bilateral 05/10/2013    Procedure: INCISION AND DRAINAGE of bilateral axillary hydradenitis;  Surgeon: Leighton Ruff, MD;  Location: [facility];  Service: General;  Laterality: Bilateral;   Family History  Problem Relation Age of Onset  . Cancer Maternal Aunt     Breast. diagnosed young  . Cancer Maternal Grandmother     Breast  . Lupus Brother    Social History  Substance Use Topics  . Smoking status: Former Smoker    Quit date: 05/02/2013  . Smokeless tobacco: None     Comment: mom smokes  . Alcohol Use: No   OB History    Gravida Para Term Preterm AB TAB SAB Ectopic Multiple Living   0 0 0 0 0 0 0 0 0 0      Review of Systems  Musculoskeletal: Positive for arthralgias (Left ankle) and gait problem.  Skin: Negative for color change and wound.  Neurological: Negative for weakness and numbness.   Allergies  Ibuprofen  Home Medications   Prior to Admission medications   Medication Sig Start Date End Date Taking? Authorizing Provider  albuterol (PROVENTIL HFA;VENTOLIN HFA) 108 (90 BASE) MCG/ACT inhaler Inhale 2 puffs into the  lungs every 6 (six) hours as needed for wheezing or shortness of breath.    Historical Provider, MD  Levonorgestrel-Ethinyl Estradiol (SEASONIQUE) 0.15-0.03 &0.01 MG tablet Take 1 tablet by mouth daily. 06/12/15   Lezlie Lye, NP  traMADol (ULTRAM) 50 MG tablet Take 1 tablet (50 mg total) by mouth every 6 (six) hours as needed. 06/12/15   Lezlie Lye, NP   Triage Vitals: BP 111/73 mmHg  Pulse 106  Temp(Src) 98.6 F (37 C) (Oral)  Resp 20  SpO2 100%   Physical Exam  Constitutional: She is oriented to person, place, and time. She appears well-developed and well-nourished. No distress.  HENT:  Head: Normocephalic and atraumatic.  Eyes: Conjunctivae and EOM  are normal.  Neck: Neck supple. No tracheal deviation present.  Cardiovascular: Normal rate.   Pulmonary/Chest: Effort normal. No respiratory distress.  Musculoskeletal: Normal range of motion. She exhibits tenderness.  Left ankle; No selling, deformity, or signs of injury Tender over the flexor tendons and the tendons that evert the ankle Pain with active flexion and internal rotation Pain with passive flexion, extension, and passive internal rotation NVI  Neurological: She is alert and oriented to person, place, and time.  Skin: Skin is warm and dry.  Psychiatric: She has a normal mood and affect. Her behavior is normal.  Nursing note and vitals reviewed.   ED Course  Procedures (including critical care time)  DIAGNOSTIC STUDIES: Oxygen Saturation is 100% on RA, normal by my interpretation.    COORDINATION OF CARE: 1:47 PM-Discussed treatment plan which includes following up with orthopedist with pt at bedside and pt agreed to plan.   Labs Review Labs Reviewed - No data to display  Imaging Review No results found.   EKG Interpretation None      MDM   Final diagnoses:  Ankle pain, left  Left ankle tendinitis   No new injury at this time. I doubt that she needs a repeat ex-ray. The patient's complaint and physical exam seemed consistent with a tendinitis. She is alert. He has a cam walker boot. She may continue wearing this for comfort. Asked the patient to ice the ankle 3-4 times daily. She must follow-up with an orthopedist as her needs are beyond the care of this emergency department. No signs of infection. Patient has a reported allergy to ibuprofen. I did prescribe her naproxen but have warned her that if she should have any side effects from this medication. She must discontinue it immediately, as well as return to the emergency department for signs or symptoms of anaphylaxis. Patient appears safe for discharge at this time. She is able to ambulate. I personally  performed the services described in this documentation, which was scribed in my presence. The recorded information has been reviewed and is accurate.         Margarita Mail, PA-C 07/17/15 1400  Debby Freiberg, MD 07/19/15 [phone]

## 2015-07-17 NOTE — Discharge Instructions (Signed)
Please discontinue using the naproxen if you develop hives, itching or rash. Return to the emergency department immediately if he develops wheezing, lip swelling, tongue swelling or other signs of severe allergic reaction.   Tendinitis and Tenosynovitis  Tendinitis is inflammation of the tendon. Tenosynovitis is inflammation of the lining around the tendon (tendon sheath). These painful conditions often occur at once. Tendons attach muscle to bone. To move a limb, force from the muscle moves through the tendon, to the bone. These conditions often cause increased pain when moving. Tendinitis may be caused by a small or partial tear in the tendon.  SYMPTOMS   Pain, tenderness, redness, bruising, or swelling at the injury.  Loss of normal joint movement.  Pain that gets worse with use of the muscle and joint attached to the tendon.  Weakness in the tendon, caused by calcium build up that may occur with tendinitis.  Commonly affected tendons:  Achilles tendon (calf of leg).  Rotator cuff (shoulder joint).  Patellar tendon (kneecap to shin).  Peroneal tendon (ankle).  Posterior tibial tendon (inner ankle).  Biceps tendon (in front of shoulder). CAUSES   Sudden strain on a flexed muscle, muscle overuse, sudden increase or change in activity, vigorous activity.  Result of a direct hit (less common).  Poor muscle action (biomechanics). RISK INCREASES WITH:  Injury (trauma).  Too much exercise.  Sudden change in athletic activity.  Incorrect exercise form or technique.  Poor strength and flexibility.  Not warming-up properly before activity.  Returning to activity before healing is complete. PREVENTION   Warm-up and stretch properly before activity.  Maintain physical fitness:  Joint flexibility.  Muscle strength and endurance.  Fitness that increases heart rate.  Learn and use proper exercise techniques.  Use rehabilitation exercises to strengthen weak muscles  and tendons.  Ice the tendon after activity, to reduce recurring inflammation.  Wear proper fitting protective equipment for specific tendons, when indicated. PROGNOSIS  When treated properly, can be cured in 6 to 8 weeks. Recovery may take longer, depending on degree of injury.  RELATED COMPLICATIONS   Re-injury or recurring symptoms.  Permanent weakness or joint stiffness, if injury is severe and recovery is not completed.  Delayed healing, if sports are started before healing is complete.  Tearing apart (rupture) of the inflamed tendon. Tendinitis means the tendon is injured and must recover. TREATMENT  Treatment first involves ice, medicine, and rest from aggravating activities. This reduces pain and inflammation. Modifying your activity may be considered to prevent recurring injury. A brace, elastic bandage wrap, splint, cast, or sling may be prescribed to protect the joint for a short period. After that period, strengthening and stretching exercise may help to regain strength and full range of motion. If the condition persists, despite non-surgical treatment, surgery may be recommended to remove the inflamed tendon lining. Corticosteroid injections may be given to reduce inflammation. However, these injections may weaken the tendon and increase your risk for tendon rupture. MEDICATION   If pain medicine is needed, nonsteroidal anti-inflammatory medicines (aspirin and ibuprofen), or other minor pain relievers (acetaminophen), are often recommended.  Do not take pain medicine for 7 days before surgery.  Prescription pain relievers are usually prescribed only after surgery. Use only as directed and only as much as you need.  Ointments applied to the skin may be helpful.  Corticosteroid injections may be given to reduce inflammation. However, this may increase your risk of a tendon rupture. HEAT AND COLD  Cold treatment (icing) relieves pain  and reduces inflammation. Cold treatment  should be applied for 10 to 15 minutes every 2 to 3 hours, and immediately after activity that aggravates your symptoms. Use ice packs or an ice massage.  Heat treatment may be used before performing stretching and strengthening activities prescribed by your caregiver, physical therapist, or athletic trainer. Use a heat pack or a warm water soak. SEEK MEDICAL CARE IF:   Symptoms get worse or do not improve, despite treatment.  Pain becomes too much to tolerate.  You develop numbness or tingling.  Toes become cold, or toenails become blue, gray, or dark colored.  New, unexplained symptoms develop. (Drugs used in treatment may produce side effects.)   This information is not intended to replace advice given to you by your health care provider. Make sure you discuss any questions you have with your health care provider.   Document Released: 08/30/2005 Document Revised: 11/22/2011 Document Reviewed: 12/12/2008 Elsevier Interactive Patient Education 2016 [employer]  Ankle Pain Ankle pain is a common symptom. The bones, cartilage, tendons, and muscles of the ankle joint perform a lot of work each day. The ankle joint holds your body weight and allows you to move around. Ankle pain can occur on either side or back of 1 or both ankles. Ankle pain may be sharp and burning or dull and aching. There may be tenderness, stiffness, redness, or warmth around the ankle. The pain occurs more often when a person walks or puts pressure on the ankle. CAUSES  There are many reasons ankle pain can develop. It is important to work with your caregiver to identify the cause since many conditions can impact the bones, cartilage, muscles, and tendons. Causes for ankle pain include:  Injury, including a break (fracture), sprain, or strain often due to a fall, sports, or a high-impact activity.  Swelling (inflammation) of a tendon (tendonitis).  Achilles tendon rupture.  Ankle instability after repeated  sprains and strains.  Poor foot alignment.  Pressure on a nerve (tarsal tunnel syndrome).  Arthritis in the ankle or the lining of the ankle.  Crystal formation in the ankle (gout or pseudogout). DIAGNOSIS  A diagnosis is based on your medical history, your symptoms, results of your physical exam, and results of diagnostic tests. Diagnostic tests may include X-ray exams or a computerized magnetic scan (magnetic resonance imaging, MRI). TREATMENT  Treatment will depend on the cause of your ankle pain and may include:  Keeping pressure off the ankle and limiting activities.  Using crutches or other walking support (a cane or brace).  Using rest, ice, compression, and elevation.  Participating in physical therapy or home exercises.  Wearing shoe inserts or special shoes.  Losing weight.  Taking medications to reduce pain or swelling or receiving an injection.  Undergoing surgery. HOME CARE INSTRUCTIONS   Only take over-the-counter or prescription medicines for pain, discomfort, or fever as directed by your caregiver.  Put ice on the injured area.  Put ice in a plastic bag.  Place a towel between your skin and the bag.  Leave the ice on for 15-20 minutes at a time, 03-04 times a day.  Keep your leg raised (elevated) when possible to lessen swelling.  Avoid activities that cause ankle pain.  Follow specific exercises as directed by your caregiver.  Record how often you have ankle pain, the location of the pain, and what it feels like. This information may be helpful to you and your caregiver.  Ask your caregiver about returning to work  or sports and whether you should drive.  Follow up with your caregiver for further examination, therapy, or testing as directed. SEEK MEDICAL CARE IF:   Pain or swelling continues or worsens beyond 1 week.  You have an oral temperature above 102 F (38.9 C).  You are feeling unwell or have chills.  You are having an increasingly  difficult time with walking.  You have loss of sensation or other new symptoms.  You have questions or concerns. MAKE SURE YOU:   Understand these instructions.  Will watch your condition.  Will get help right away if you are not doing well or get worse.   This information is not intended to replace advice given to you by your health care provider. Make sure you discuss any questions you have with your health care provider.   Document Released: 02/17/2010 Document Revised: 11/22/2011 Document Reviewed: 04/01/2015 Elsevier Interactive Patient Education 2016 [place].  Cryotherapy Cryotherapy means treatment with cold. Ice or gel packs can be used to reduce both pain and swelling. Ice is the most helpful within the first 24 to 48 hours after an injury or flare-up from overusing a muscle or joint. Sprains, strains, spasms, burning pain, shooting pain, and aches can all be eased with ice. Ice can also be used when recovering from surgery. Ice is effective, has very few side effects, and is safe for most people to use. PRECAUTIONS  Ice is not a safe treatment option for people with:  Raynaud phenomenon. This is a condition affecting small blood vessels in the extremities. Exposure to cold may cause your problems to return.  Cold hypersensitivity. There are many forms of cold hypersensitivity, including:  Cold urticaria. Red, itchy hives appear on the skin when the tissues begin to warm after being iced.  Cold erythema. This is a red, itchy rash caused by exposure to cold.  Cold hemoglobinuria. Red blood cells break down when the tissues begin to warm after being iced. The hemoglobin that carry oxygen are passed into the urine because they cannot combine with blood proteins fast enough.  Numbness or altered sensitivity in the area being iced. If you have any of the following conditions, do not use ice until you have discussed cryotherapy with your caregiver:  Heart conditions, such  as arrhythmia, angina, or chronic heart disease.  High blood pressure.  Healing wounds or open skin in the area being iced.  Current infections.  Rheumatoid arthritis.  Poor circulation.  Diabetes. Ice slows the blood flow in the region it is applied. This is beneficial when trying to stop inflamed tissues from spreading irritating chemicals to surrounding tissues. However, if you expose your skin to cold temperatures for too long or without the proper protection, you can damage your skin or nerves. Watch for signs of skin damage due to cold. HOME CARE INSTRUCTIONS Follow these tips to use ice and cold packs safely.  Place a dry or damp towel between the ice and skin. A damp towel will cool the skin more quickly, so you may need to shorten the time that the ice is used.  For a more rapid response, add gentle compression to the ice.  Ice for no more than 10 to 20 minutes at a time. The bonier the area you are icing, the less time it will take to get the benefits of ice.  Check your skin after 5 minutes to make sure there are no signs of a poor response to cold or skin damage.  Rest 20 minutes or more between uses.  Once your skin is numb, you can end your treatment. You can test numbness by very lightly touching your skin. The touch should be so light that you do not see the skin dimple from the pressure of your fingertip. When using ice, most people will feel these normal sensations in this order: cold, burning, aching, and numbness.  Do not use ice on someone who cannot communicate their responses to pain, such as small children or people with dementia. HOW TO MAKE AN ICE PACK Ice packs are the most common way to use ice therapy. Other methods include ice massage, ice baths, and cryosprays. Muscle creams that cause a cold, tingly feeling do not offer the same benefits that ice offers and should not be used as a substitute unless recommended by your caregiver. To make an ice pack, do  one of the following:  Place crushed ice or a bag of frozen vegetables in a sealable plastic bag. Squeeze out the excess air. Place this bag inside another plastic bag. Slide the bag into a pillowcase or place a damp towel between your skin and the bag.  Mix 3 parts water with 1 part rubbing alcohol. Freeze the mixture in a sealable plastic bag. When you remove the mixture from the freezer, it will be slushy. Squeeze out the excess air. Place this bag inside another plastic bag. Slide the bag into a pillowcase or place a damp towel between your skin and the bag. SEEK MEDICAL CARE IF:  You develop white spots on your skin. This may give the skin a blotchy (mottled) appearance.  Your skin turns blue or pale.  Your skin becomes waxy or hard.  Your swelling gets worse. MAKE SURE YOU:   Understand these instructions.  Will watch your condition.  Will get help right away if you are not doing well or get worse.   This information is not intended to replace advice given to you by your health care provider. Make sure you discuss any questions you have with your health care provider.   Document Released: 04/26/2011 Document Revised: 09/20/2014 Document Reviewed: 04/26/2011 Elsevier Interactive Patient Education 2016 [employer] Naproxen and naproxen sodium oral immediate-release tablets What is this medicine? NAPROXEN (na PROX en) is a non-steroidal anti-inflammatory drug (NSAID). It is used to reduce swelling and to treat pain. This medicine may be used for dental pain, headache, or painful monthly periods. It is also used for painful joint and muscular problems such as arthritis, tendinitis, bursitis, and gout. This medicine may be used for other purposes; ask your health care provider or pharmacist if you have questions. What should I tell my health care provider before I take this medicine? They need to know if you have any of these conditions: -asthma -cigarette smoker -drink more  than 3 alcohol containing drinks a day -heart disease or circulation problems such as heart failure or leg edema (fluid retention) -high blood pressure -kidney disease -liver disease -stomach bleeding or ulcers -an unusual or allergic reaction to naproxen, aspirin, other NSAIDs, other medicines, foods, dyes, or preservatives -pregnant or trying to get pregnant -breast-feeding How should I use this medicine? Take this medicine by mouth with a glass of water. Follow the directions on the prescription label. Take it with food if your stomach gets upset. Try to not lie down for at least 10 minutes after you take it. Take your medicine at regular intervals. Do not take your medicine more often than  directed. Long-term, continuous use may increase the risk of heart attack or stroke. A special MedGuide will be given to you by the pharmacist with each prescription and refill. Be sure to read this information carefully each time. Talk to your pediatrician regarding the use of this medicine in children. Special care may be needed. Overdosage: If you think you have taken too much of this medicine contact a poison control center or emergency room at once. NOTE: This medicine is only for you. Do not share this medicine with others. What if I miss a dose? If you miss a dose, take it as soon as you can. If it is almost time for your next dose, take only that dose. Do not take double or extra doses. What may interact with this medicine? -alcohol -aspirin -cidofovir -diuretics -lithium -methotrexate -other drugs for inflammation like ketorolac or prednisone -pemetrexed -probenecid -warfarin This list may not describe all possible interactions. Give your health care provider a list of all the medicines, herbs, non-prescription drugs, or dietary supplements you use. Also tell them if you smoke, drink alcohol, or use illegal drugs. Some items may interact with your medicine. What should I watch for while  using this medicine? Tell your doctor or health care professional if your pain does not get better. Talk to your doctor before taking another medicine for pain. Do not treat yourself. This medicine does not prevent heart attack or stroke. In fact, this medicine may increase the chance of a heart attack or stroke. The chance may increase with longer use of this medicine and in people who have heart disease. If you take aspirin to prevent heart attack or stroke, talk with your doctor or health care professional. Do not take other medicines that contain aspirin, ibuprofen, or naproxen with this medicine. Side effects such as stomach upset, nausea, or ulcers may be more likely to occur. Many medicines available without a prescription should not be taken with this medicine. This medicine can cause ulcers and bleeding in the stomach and intestines at any time during treatment. Do not smoke cigarettes or drink alcohol. These increase irritation to your stomach and can make it more susceptible to damage from this medicine. Ulcers and bleeding can happen without warning symptoms and can cause death. You may get drowsy or dizzy. Do not drive, use machinery, or do anything that needs mental alertness until you know how this medicine affects you. Do not stand or sit up quickly, especially if you are an older patient. This reduces the risk of dizzy or fainting spells. This medicine can cause you to bleed more easily. Try to avoid damage to your teeth and gums when you brush or floss your teeth. What side effects may I notice from receiving this medicine? Side effects that you should report to your doctor or health care professional as soon as possible: -black or bloody stools, blood in the urine or vomit -blurred vision -chest pain -difficulty breathing or wheezing -nausea or vomiting -severe stomach pain -skin rash, skin redness, blistering or peeling skin, hives, or itching -slurred speech or weakness on one  side of the body -swelling of eyelids, throat, lips -unexplained weight gain or swelling -unusually weak or tired -yellowing of eyes or skin Side effects that usually do not require medical attention (report to your doctor or health care professional if they continue or are bothersome): -constipation -headache -heartburn This list may not describe all possible side effects. Call your doctor for medical advice about side effects. You  may report side effects to FDA at 1-800-FDA-1088. Where should I keep my medicine? Keep out of the reach of children. Store at room temperature between 15 and 30 degrees C (59 and 86 degrees F). Keep container tightly closed. Throw away any unused medicine after the expiration date. NOTE: This sheet is a summary. It may not cover all possible information. If you have questions about this medicine, talk to your doctor, pharmacist, or health care provider.    2016, Elsevier/[employer]. (2009-09-01 20:10:16)  Tramadol tablets What is this medicine? TRAMADOL (TRA ma dole) is a pain reliever. It is used to treat moderate to severe pain in adults. This medicine may be used for other purposes; ask your health care provider or pharmacist if you have questions. What should I tell my health care provider before I take this medicine? They need to know if you have any of these conditions: -brain tumor -depression -drug abuse or addiction -head injury -if you frequently drink alcohol containing drinks -kidney disease or trouble passing urine -liver disease -lung disease, asthma, or breathing problems -seizures or epilepsy -suicidal thoughts, plans, or attempt; a previous suicide attempt by you or a family member -an unusual or allergic reaction to tramadol, codeine, other medicines, foods, dyes, or preservatives -pregnant or trying to get pregnant -breast-feeding How should I use this medicine? Take this medicine by mouth with a full glass of water. Follow the  directions on the prescription label. If the medicine upsets your stomach, take it with food or milk. Do not take more medicine than you are told to take. Talk to your pediatrician regarding the use of this medicine in children. Special care may be needed. Overdosage: If you think you have taken too much of this medicine contact a poison control center or emergency room at once. NOTE: This medicine is only for you. Do not share this medicine with others. What if I miss a dose? If you miss a dose, take it as soon as you can. If it is almost time for your next dose, take only that dose. Do not take double or extra doses. What may interact with this medicine? Do not take this medicine with any of the following medications: -MAOIs like Carbex, Eldepryl, Marplan, Nardil, and Parnate This medicine may also interact with the following medications: -alcohol or medicines that contain alcohol -antihistamines -benzodiazepines -bupropion -carbamazepine or oxcarbazepine -clozapine -cyclobenzaprine -digoxin -furazolidone -linezolid -medicines for depression, anxiety, or psychotic disturbances -medicines for migraine headache like almotriptan, eletriptan, frovatriptan, naratriptan, rizatriptan, sumatriptan, zolmitriptan -medicines for pain like pentazocine, buprenorphine, butorphanol, meperidine, nalbuphine, and propoxyphene -medicines for sleep -muscle relaxants -naltrexone -phenobarbital -phenothiazines like perphenazine, thioridazine, chlorpromazine, mesoridazine, fluphenazine, prochlorperazine, promazine, and trifluoperazine -procarbazine -warfarin This list may not describe all possible interactions. Give your health care provider a list of all the medicines, herbs, non-prescription drugs, or dietary supplements you use. Also tell them if you smoke, drink alcohol, or use illegal drugs. Some items may interact with your medicine. What should I watch for while using this medicine? Tell your  doctor or health care professional if your pain does not go away, if it gets worse, or if you have new or a different type of pain. You may develop tolerance to the medicine. Tolerance means that you will need a higher dose of the medicine for pain relief. Tolerance is normal and is expected if you take this medicine for a long time. Do not suddenly stop taking your medicine because you may develop a severe reaction.  Your body becomes used to the medicine. This does NOT mean you are addicted. Addiction is a behavior related to getting and using a drug for a non-medical reason. If you have pain, you have a medical reason to take pain medicine. Your doctor will tell you how much medicine to take. If your doctor wants you to stop the medicine, the dose will be slowly lowered over time to avoid any side effects. You may get drowsy or dizzy. Do not drive, use machinery, or do anything that needs mental alertness until you know how this medicine affects you. Do not stand or sit up quickly, especially if you are an older patient. This reduces the risk of dizzy or fainting spells. Alcohol can increase or decrease the effects of this medicine. Avoid alcoholic drinks. You may have constipation. Try to have a bowel movement at least every 2 to 3 days. If you do not have a bowel movement for 3 days, call your doctor or health care professional. Your mouth may get dry. Chewing sugarless gum or sucking hard candy, and drinking plenty of water may help. Contact your doctor if the problem does not go away or is severe. What side effects may I notice from receiving this medicine? Side effects that you should report to your doctor or health care professional as soon as possible: -allergic reactions like skin rash, itching or hives, swelling of the face, lips, or tongue -breathing difficulties, wheezing -confusion -itching -light headedness or fainting spells -redness, blistering, peeling or loosening of the skin,  including inside the mouth -seizures Side effects that usually do not require medical attention (report to your doctor or health care professional if they continue or are bothersome): -constipation -dizziness -drowsiness -headache -nausea, vomiting This list may not describe all possible side effects. Call your doctor for medical advice about side effects. You may report side effects to FDA at 1-800-FDA-1088. Where should I keep my medicine? Keep out of the reach of children. This medicine may cause accidental overdose and death if it taken by other adults, children, or pets. Mix any unused medicine with a substance like cat litter or coffee grounds. Then throw the medicine away in a sealed container like a sealed bag or a coffee can with a lid. Do not use the medicine after the expiration date. Store at room temperature between 15 and 30 degrees C (59 and 86 degrees F). NOTE: This sheet is a summary. It may not cover all possible information. If you have questions about this medicine, talk to your doctor, pharmacist, or health care provider.    2016, Elsevier/Gold Standard. (2013-10-26 15:42:09)

## 2015-07-17 NOTE — ED Notes (Signed)
Patient reports left ankle pain, hx of fracture apporx 3 months ago. Boot on foot at this time. Patient states she has not been wearing the boot for approx 3 weeks. States this am was not able to place weight on foot.

## 2015-07-24 ENCOUNTER — Emergency Department (HOSPITAL_COMMUNITY): Payer: Medicaid Other

## 2015-07-24 ENCOUNTER — Encounter (HOSPITAL_COMMUNITY): Payer: Self-pay

## 2015-07-24 ENCOUNTER — Emergency Department (HOSPITAL_COMMUNITY)
Admission: EM | Admit: 2015-07-24 | Discharge: 2015-07-24 | Disposition: A | Discharge status: Home / Self Care | Payer: Medicaid Other | Attending: Emergency Medicine | Admitting: Emergency Medicine

## 2015-07-24 DIAGNOSIS — R55 Syncope and collapse: Secondary | ICD-10-CM | POA: Diagnosis present

## 2015-07-24 DIAGNOSIS — Z3202 Encounter for pregnancy test, result negative: Secondary | ICD-10-CM | POA: Insufficient documentation

## 2015-07-24 DIAGNOSIS — Z872 Personal history of diseases of the skin and subcutaneous tissue: Secondary | ICD-10-CM | POA: Insufficient documentation

## 2015-07-24 DIAGNOSIS — Z87891 Personal history of nicotine dependence: Secondary | ICD-10-CM | POA: Diagnosis not present

## 2015-07-24 DIAGNOSIS — J45909 Unspecified asthma, uncomplicated: Secondary | ICD-10-CM | POA: Diagnosis not present

## 2015-07-24 DIAGNOSIS — Z79899 Other long term (current) drug therapy: Secondary | ICD-10-CM | POA: Insufficient documentation

## 2015-07-24 LAB — URINALYSIS, ROUTINE W REFLEX MICROSCOPIC
Bilirubin Urine: NEGATIVE
GLUCOSE, UA: NEGATIVE mg/dL
HGB URINE DIPSTICK: NEGATIVE
Ketones, ur: NEGATIVE mg/dL
Leukocytes, UA: NEGATIVE
Nitrite: NEGATIVE
Protein, ur: NEGATIVE mg/dL
Specific Gravity, Urine: 1.015 (ref 1.005–1.030)
Urobilinogen, UA: 1 mg/dL (ref 0.0–1.0)
pH: 7 (ref 5.0–8.0)

## 2015-07-24 LAB — BASIC METABOLIC PANEL
ANION GAP: 5 (ref 5–15)
BUN: 12 mg/dL (ref 6–20)
CO2: 24 mmol/L (ref 22–32)
Calcium: 8.7 mg/dL — ABNORMAL LOW (ref 8.9–10.3)
Chloride: 110 mmol/L (ref 101–111)
Creatinine, Ser: 0.89 mg/dL (ref 0.44–1.00)
GFR calc Af Amer: >60 mL/min (ref >60)
GFR calc non Af Amer: >60 mL/min (ref >60)
GLUCOSE: 88 mg/dL (ref 65–99)
POTASSIUM: 4 mmol/L (ref 3.5–5.1)
Sodium: 139 mmol/L (ref 135–145)

## 2015-07-24 LAB — CBC WITH DIFFERENTIAL/PLATELET
Basophils Absolute: 0 10*3/uL (ref 0.0–0.1)
Basophils Relative: 0 %
Eosinophils Absolute: 0.2 10*3/uL (ref 0.0–0.7)
Eosinophils Relative: 3 %
HEMATOCRIT: 34.1 % — AB (ref 36.0–46.0)
Hemoglobin: 11.4 g/dL — ABNORMAL LOW (ref 12.0–15.0)
LYMPHS PCT: 16 %
Lymphs Abs: 1.2 10*3/uL (ref 0.7–4.0)
MCH: 31.1 pg (ref 26.0–34.0)
MCHC: 33.4 g/dL (ref 30.0–36.0)
MCV: 93.2 fL (ref 78.0–100.0)
MONO ABS: 0.6 10*3/uL (ref 0.1–1.0)
MONOS PCT: 8 %
NEUTROS ABS: 5.5 10*3/uL (ref 1.7–7.7)
Neutrophils Relative %: 73 %
Platelets: 199 10*3/uL (ref 150–400)
RBC: 3.66 MIL/uL — ABNORMAL LOW (ref 3.87–5.11)
RDW: 13.5 % (ref 11.5–15.5)
WBC: 7.5 10*3/uL (ref 4.0–10.5)

## 2015-07-24 LAB — POC URINE PREG, ED: Preg Test, Ur: NEGATIVE

## 2015-07-24 MED ORDER — SODIUM CHLORIDE 0.9 % IV BOLUS (SEPSIS)
1000.0000 mL | Freq: Once | INTRAVENOUS | Status: AC
  Administered 2015-07-24: 1000 mL via INTRAVENOUS

## 2015-07-24 NOTE — ED Notes (Signed)
Pt arrived via EMS from [facility] where she works c/o unwitnessed syncope in bathroom.  Pt states she had just had a bowel movement prior.  Denies any pain.  Pt is slow to respond and sleepy on arrival.

## 2015-07-24 NOTE — ED Provider Notes (Signed)
Arrival Date & Time: 07/24/15 & 1531 History   Chief Complaint  Patient presents with  . Loss of Consciousness   HPI Jessica Fitzpatrick is a 19 y.o. female who presents after episode of work related syncope. 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 Patient states that she started her period yesterday and states that she has had cough for 1 day otherwise no concerns. Patient denies headache neck pain chest pain shortness of breath abdominal pain back pain urinary symptoms abnormal sensation weakness vaginal discharge or any vaginal bleeding that is different from prior menstrual episodes.   Past Medical History  I reviewed & agree with nursing's documentation of PMHx, PSHx, SHx & FHx. Past Medical History  Diagnosis Date  . Asthma Jan 2013    no occurences since then  . Headache(784.0)     associated with menses  . 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 Incision and drainage abscess Bilateral 05/10/2013    Procedure: INCISION AND DRAINAGE of bilateral axillary hydradenitis;  Surgeon: Leighton Ruff, MD;  Location: [facility];  Service: General;  Laterality: Bilateral;   Social History   Social History  . Marital Status: Single    Spouse Name: N/A  . Number of Children: N/A  . Years of Education: N/A   Social History Main Topics  . Smoking status: Former Smoker    Quit date: 05/02/2013  . Smokeless tobacco: None     Comment: mom smokes  . Alcohol Use: No  . Drug Use: No  . Sexual Activity: Yes    Birth Control/ Protection: None   Other Topics Concern  . None   Social History Narrative   Lives with mom currently but was in aunt's custody from age 42 until recently.   Family  History  Problem Relation Age of Onset  . Cancer Maternal Aunt     Breast. diagnosed young  . Cancer Maternal Grandmother     Breast  . Lupus Brother     Review of Systems   Complete Review of Systems obtained and is negative except as stated in HPI.  Allergies  Ibuprofen  Home Medications   Prior to Admission medications   Medication Sig Start Date End Date Taking? Authorizing Provider  albuterol (PROVENTIL HFA;VENTOLIN HFA) 108 (90 BASE) MCG/ACT inhaler Inhale 2 puffs into the lungs every 6 (six) hours as needed for wheezing or shortness of breath.   Yes Historical Provider, MD  naproxen (NAPROSYN) 500 MG tablet Take 1 tablet (500 mg total) by mouth 2 (two) times daily with a meal. Patient taking differently: Take 500 mg by mouth 2 (two) times daily as needed for mild pain or moderate pain.  07/17/15  Yes Margarita Mail, PA-C  traMADol (ULTRAM) 50 MG tablet Take 1 tablet (50 mg total) by mouth every 6 (six) hours as needed. Patient taking differently: Take 50 mg by mouth every 6 (six) hours as needed for moderate pain or severe pain.  07/17/15  Yes Margarita Mail, PA-C  Levonorgestrel-Ethinyl Estradiol (SEASONIQUE) 0.15-0.03 &0.01 MG tablet Take 1 tablet by mouth daily. Patient not taking: Reported on 07/24/2015 06/12/15   Jessica Lye, NP    Physical Exam  BP 98/78 mmHg  Pulse 76  Resp 21  Ht 5\' 4"  (1.626 m)  Wt 135 lb (61.236 kg)  BMI 23.16 kg/m2  SpO2 97%  LMP 07/24/2015 (Exact Date) Physical Exam  Constitutional: She is oriented to person, place, and time. She appears well-developed and well-nourished.  Non-toxic appearance. She does not appear ill. No distress.  HENT:  Head: Normocephalic and atraumatic.  Right Ear: External ear normal.  Left Ear: External ear normal.  Eyes: Pupils are equal, round, and reactive to light. No scleral icterus.  Neck: Normal range of motion. Neck supple. No tracheal deviation present.  Cardiovascular: Normal heart sounds and intact  distal pulses.   No murmur heard. Pulmonary/Chest: Effort normal and breath sounds normal. No stridor. No respiratory distress. She has no wheezes. She has no rales.  Abdominal: Soft. Bowel sounds are normal. She exhibits no distension. There is no tenderness. There is no rebound and no guarding.  Musculoskeletal: Normal range of motion.  Neurological: She is alert and oriented to person, place, and time. She has normal strength and normal reflexes. No cranial nerve deficit or sensory deficit.  Skin: Skin is warm and dry. No pallor.  Psychiatric: She has a normal mood and affect. Her behavior is normal.  Nursing note and vitals reviewed.  ED Course  Procedures  Labs Review Labs Reviewed  BASIC METABOLIC PANEL - Abnormal; Notable for the following:    Calcium 8.7 (*)    All other components within normal limits  CBC WITH DIFFERENTIAL/PLATELET - Abnormal; Notable for the following:    RBC 3.66 (*)    Hemoglobin 11.4 (*)    HCT 34.1 (*)    All other components within normal limits  URINALYSIS, ROUTINE W REFLEX MICROSCOPIC (NOT AT [facility])  POC URINE PREG, ED   Imaging Review Dg Chest 2 View  07/24/2015  CLINICAL DATA:  Syncope this afternoon, history asthma, former smoker EXAM: CHEST  2 VIEW COMPARISON:  None FINDINGS: Normal heart size, mediastinal contours, and pulmonary vascularity. Lungs clear. No pneumothorax. Bones unremarkable. IMPRESSION: Normal exam. Electronically Signed   By: [name]   On: 07/24/2015 16:58    Laboratory and Imaging results were personally reviewed by myself and used in the medical decision making of this patient's treatment and disposition.  EKG Interpretation  EKG Interpretation  Date/Time:  Thursday July 24 2015 17:07:26 EST Ventricular Rate:  69 PR Interval:  161 QRS Duration: 78 QT Interval:  395 QTC Calculation: 423 R Axis:   44 Text Interpretation:  Sinus arrhythmia Sinus arrhythmia Abnormal ekg Confirmed by Jessica Muskrat  MD  [phone]) on 07/24/2015 5:50:00 PM      MDM  Jessica Fitzpatrick is a 19 y.o. female with H&P as above. ED clinical course as follows:  Patient is in NAD. Vitals stable and clinically appropriate. Patient denies fevers or chills and is afebrile at this time without any concerning vital signs therefore do not suspect pneumonia or respiratory tract infection. Patient has history of asthma however denies any wheezing or chest tightness. Given exam without wheezing or stridor do not believe obstructive element to cough nor asthma exacerbation. Abdominal exam is benign and without concern no rebound or guarding. No concerning vaginal symptoms or urinary symptoms. Urine studies negative as is urine pregnancy. Chest x-ray upon my independent review shows no acute abnormalities. CBC and CMP reveal no remarkable abnormalities except for mildly  decreased hemoglobin at 11 however given patient is currently on menses requires no intervention at this time. Patient is  a symptomatically at this time well-appearing and conversation and at discharge regarding appropriate follow-up and that should concerning symptoms or symptoms of lightheadedness weakness or abnormal mental status she is to report immediately to the emergency department for repeat evaluation. Given patient's history of prior event patient was advised to have follow-up with her primary care physician within the next 2 days and have conversation regarding possible referral to cardiology.  Presentation consistent with prodromal syncope.  Disposition: Discharge.  Clinical Impression:  1. Syncope and collapse    Patient care discussed with Dr. Vanita Panda, who oversaw their evaluation & treatment & voiced agreement. House Officer: Voncille Lo, MD, Emergency Medicine.  Voncille Lo, MD 07/24/15 [id number]  Jessica Muskrat, MD 07/24/15 [phone]

## 2015-07-24 NOTE — Discharge Instructions (Signed)
Near-Syncope °Near-syncope (commonly known as near fainting) is sudden weakness, dizziness, or feeling like you might pass out. During an episode of near-syncope, you may also develop pale skin, have tunnel vision, or feel sick to your stomach (nauseous). Near-syncope may occur when getting up after sitting or while standing for a long time. It is caused by a sudden decrease in blood flow to the brain. This decrease can result from various causes or triggers, most of which are not serious. However, because near-syncope can sometimes be a sign of something serious, a medical evaluation is required. The specific cause is often not determined. °HOME CARE INSTRUCTIONS  °Monitor your condition for any changes. The following actions may help to alleviate any discomfort you are experiencing: °· Have someone stay with you until you feel stable. °· Lie down right away and prop your feet up if you start feeling like you might faint. Breathe deeply and steadily. Wait until all the symptoms have passed. Most of these episodes last only a few minutes. You may feel tired for several hours.   °· Drink enough fluids to keep your urine clear or pale yellow.   °· If you are taking blood pressure or heart medicine, get up slowly when seated or lying down. Take several minutes to sit and then stand. This can reduce dizziness. °· Follow up with your health care provider as directed.  °SEEK IMMEDIATE MEDICAL CARE IF:  °· You have a severe headache.   °· You have unusual pain in the chest, abdomen, or back.   °· You are bleeding from the mouth or rectum, or you have black or tarry stool.   °· You have an irregular or very fast heartbeat.   °· You have repeated fainting or have seizure-like jerking during an episode.   °· You faint when sitting or lying down.   °· You have confusion.   °· You have difficulty walking.   °· You have severe weakness.   °· You have vision problems.   °MAKE SURE YOU:  °· Understand these instructions. °· Will  watch your condition. °· Will get help right away if you are not doing well or get worse. °  °This information is not intended to replace advice given to you by your health care provider. Make sure you discuss any questions you have with your health care provider. °  °Document Released: 08/30/2005 Document Revised: 09/04/2013 Document Reviewed: 02/02/2013 °Elsevier Interactive Patient Education ©2016 Elsevier Inc. ° °Syncope °Syncope is a medical term for fainting or passing out. This means you lose consciousness and drop to the ground. People are generally unconscious for less than 5 minutes. You may have some muscle twitches for up to 15 seconds before waking up and returning to normal. Syncope occurs more often in older adults, but it can happen to anyone. While most causes of syncope are not dangerous, syncope can be a sign of a serious medical problem. It is important to seek medical care.  °CAUSES  °Syncope is caused by a sudden drop in blood flow to the brain. The specific cause is often not determined. Factors that can bring on syncope include: °· Taking medicines that lower blood pressure. °· Sudden changes in posture, such as standing up quickly. °· Taking more medicine than prescribed. °· Standing in one place for too long. °· Seizure disorders. °· Dehydration and excessive exposure to heat. °· Low blood sugar (hypoglycemia). °· Straining to have a bowel movement. °· Heart disease, irregular heartbeat, or other circulatory problems. °· Fear, emotional distress, seeing blood, or severe pain. °SYMPTOMS  °  Right before fainting, you may: °· Feel dizzy or light-headed. °· Feel nauseous. °· See all white or all black in your field of vision. °· Have cold, clammy skin. °DIAGNOSIS  °Your health care provider will ask about your symptoms, perform a physical exam, and perform an electrocardiogram (ECG) to record the electrical activity of your heart. Your health care provider may also perform other heart or blood  tests to determine the cause of your syncope which may include: °· Transthoracic echocardiogram (TTE). During echocardiography, sound waves are used to evaluate how blood flows through your heart. °· Transesophageal echocardiogram (TEE). °· Cardiac monitoring. This allows your health care provider to monitor your heart rate and rhythm in real time. °· Holter monitor. This is a portable device that records your heartbeat and can help diagnose heart arrhythmias. It allows your health care provider to track your heart activity for several days, if needed. °· Stress tests by exercise or by giving medicine that makes the heart beat faster. °TREATMENT  °In most cases, no treatment is needed. Depending on the cause of your syncope, your health care provider may recommend changing or stopping some of your medicines. °HOME CARE INSTRUCTIONS °· Have someone stay with you until you feel stable. °· Do not drive, use machinery, or play sports until your health care provider says it is okay. °· Keep all follow-up appointments as directed by your health care provider. °· Lie down right away if you start feeling like you might faint. Breathe deeply and steadily. Wait until all the symptoms have passed. °· Drink enough fluids to keep your urine clear or pale yellow. °· If you are taking blood pressure or heart medicine, get up slowly and take several minutes to sit and then stand. This can reduce dizziness. °SEEK IMMEDIATE MEDICAL CARE IF:  °· You have a severe headache. °· You have unusual pain in the chest, abdomen, or back. °· You are bleeding from your mouth or rectum, or you have black or tarry stool. °· You have an irregular or very fast heartbeat. °· You have pain with breathing. °· You have repeated fainting or seizure-like jerking during an episode. °· You faint when sitting or lying down. °· You have confusion. °· You have trouble walking. °· You have severe weakness. °· You have vision problems. °If you fainted, call your  local emergency services (911 in [place] Do not drive yourself to the hospital.  °  °This information is not intended to replace advice given to you by your health care provider. Make sure you discuss any questions you have with your health care provider. °  °Document Released: 08/30/2005 Document Revised: 01/14/2015 Document Reviewed: 10/29/2011 °Elsevier Interactive Patient Education ©2016 Elsevier Inc. ° °

## 2015-07-26 ENCOUNTER — Encounter (HOSPITAL_COMMUNITY): Payer: Self-pay

## 2015-07-26 ENCOUNTER — Emergency Department (HOSPITAL_COMMUNITY)
Admission: EM | Admit: 2015-07-26 | Discharge: 2015-07-26 | Disposition: A | Discharge status: Home / Self Care | Payer: Medicaid Other | Attending: Emergency Medicine | Admitting: Emergency Medicine

## 2015-07-26 ENCOUNTER — Emergency Department (HOSPITAL_COMMUNITY): Payer: Medicaid Other

## 2015-07-26 DIAGNOSIS — Z791 Long term (current) use of non-steroidal anti-inflammatories (NSAID): Secondary | ICD-10-CM | POA: Diagnosis not present

## 2015-07-26 DIAGNOSIS — L0291 Cutaneous abscess, unspecified: Secondary | ICD-10-CM

## 2015-07-26 DIAGNOSIS — L0231 Cutaneous abscess of buttock: Secondary | ICD-10-CM | POA: Insufficient documentation

## 2015-07-26 DIAGNOSIS — Z79899 Other long term (current) drug therapy: Secondary | ICD-10-CM | POA: Diagnosis not present

## 2015-07-26 DIAGNOSIS — Z87891 Personal history of nicotine dependence: Secondary | ICD-10-CM | POA: Diagnosis not present

## 2015-07-26 DIAGNOSIS — J45909 Unspecified asthma, uncomplicated: Secondary | ICD-10-CM | POA: Insufficient documentation

## 2015-07-26 DIAGNOSIS — Z79818 Long term (current) use of other agents affecting estrogen receptors and estrogen levels: Secondary | ICD-10-CM | POA: Insufficient documentation

## 2015-07-26 LAB — BASIC METABOLIC PANEL
ANION GAP: 10 (ref 5–15)
BUN: 12 mg/dL (ref 6–20)
CHLORIDE: 108 mmol/L (ref 101–111)
CO2: 22 mmol/L (ref 22–32)
CREATININE: 0.72 mg/dL (ref 0.44–1.00)
Calcium: 8.9 mg/dL (ref 8.9–10.3)
GFR calc non Af Amer: >60 mL/min (ref >60)
Glucose, Bld: 78 mg/dL (ref 65–99)
Potassium: 3.5 mmol/L (ref 3.5–5.1)
Sodium: 140 mmol/L (ref 135–145)

## 2015-07-26 LAB — CBC WITH DIFFERENTIAL/PLATELET
BASOS PCT: 0 %
Basophils Absolute: 0 10*3/uL (ref 0.0–0.1)
Eosinophils Absolute: 0.1 10*3/uL (ref 0.0–0.7)
Eosinophils Relative: 1 %
HEMATOCRIT: 33 % — AB (ref 36.0–46.0)
HEMOGLOBIN: 11.4 g/dL — AB (ref 12.0–15.0)
Lymphocytes Relative: 16 %
Lymphs Abs: 1.6 10*3/uL (ref 0.7–4.0)
MCH: 32 pg (ref 26.0–34.0)
MCHC: 34.5 g/dL (ref 30.0–36.0)
MCV: 92.7 fL (ref 78.0–100.0)
MONOS PCT: 8 %
Monocytes Absolute: 0.8 10*3/uL (ref 0.1–1.0)
NEUTROS ABS: 7.6 10*3/uL (ref 1.7–7.7)
NEUTROS PCT: 75 %
Platelets: 257 10*3/uL (ref 150–400)
RBC: 3.56 MIL/uL — ABNORMAL LOW (ref 3.87–5.11)
RDW: 13.3 % (ref 11.5–15.5)
WBC: 10.2 10*3/uL (ref 4.0–10.5)

## 2015-07-26 LAB — I-STAT BETA HCG BLOOD, ED (MC, WL, AP ONLY): I-stat hCG, quantitative: <5 m[IU]/mL (ref <5)

## 2015-07-26 MED ORDER — IOHEXOL 300 MG/ML  SOLN
100.0000 mL | Freq: Once | INTRAMUSCULAR | Status: AC | PRN
  Administered 2015-07-26: 100 mL via INTRAVENOUS

## 2015-07-26 MED ORDER — FENTANYL CITRATE (PF) 100 MCG/2ML IJ SOLN
100.0000 ug | Freq: Once | INTRAMUSCULAR | Status: AC
  Administered 2015-07-26: 100 ug via NASAL
  Filled 2015-07-26: qty 2

## 2015-07-26 MED ORDER — OXYCODONE-ACETAMINOPHEN 5-325 MG PO TABS: 1.0000 | ORAL_TABLET | ORAL | Status: DC | PRN

## 2015-07-26 MED ORDER — HYDROMORPHONE HCL 1 MG/ML IJ SOLN
1.0000 mg | Freq: Once | INTRAMUSCULAR | Status: AC
  Administered 2015-07-26: 1 mg via INTRAVENOUS
  Filled 2015-07-26: qty 1

## 2015-07-26 MED ORDER — AMOXICILLIN-POT CLAVULANATE 875-125 MG PO TABS: 1.0000 | ORAL_TABLET | Freq: Two times a day (BID) | ORAL | Status: DC

## 2015-07-26 MED ORDER — SODIUM CHLORIDE 0.9 % IV BOLUS (SEPSIS)
1000.0000 mL | Freq: Once | INTRAVENOUS | Status: AC
  Administered 2015-07-26: 1000 mL via INTRAVENOUS

## 2015-07-26 MED ORDER — ONDANSETRON 4 MG PO TBDP
4.0000 mg | ORAL_TABLET | Freq: Once | ORAL | Status: AC
  Administered 2015-07-26: 4 mg via ORAL
  Filled 2015-07-26: qty 1

## 2015-07-26 MED ORDER — ONDANSETRON HCL 4 MG/2ML IJ SOLN
4.0000 mg | Freq: Once | INTRAMUSCULAR | Status: AC
  Administered 2015-07-26: 4 mg via INTRAVENOUS
  Filled 2015-07-26: qty 2

## 2015-07-26 MED ORDER — AMOXICILLIN-POT CLAVULANATE 875-125 MG PO TABS
1.0000 | ORAL_TABLET | Freq: Once | ORAL | Status: AC
  Administered 2015-07-26: 1 via ORAL
  Filled 2015-07-26: qty 1

## 2015-07-26 MED ORDER — LIDOCAINE-EPINEPHRINE (PF) 2 %-1:200000 IJ SOLN
20.0000 mL | Freq: Once | INTRAMUSCULAR | Status: AC
  Administered 2015-07-26: 20 mL via INTRADERMAL
  Filled 2015-07-26: qty 20

## 2015-07-26 MED ORDER — SODIUM CHLORIDE 0.9 % IV BOLUS (SEPSIS)
500.0000 mL | Freq: Once | INTRAVENOUS | Status: AC
  Administered 2015-07-26: 500 mL via INTRAVENOUS

## 2015-07-26 NOTE — ED Provider Notes (Signed)
[name]: [id number]     Arrival date & time 07/26/15  1600 History  By signing my name below, I, Irene Pap, attest that this documentation has been prepared under the direction and in the presence of Delsa Grana, PA-C. Electronically Signed: Irene Pap, ED Scribe. 07/26/2015. 7:22 PM.   Chief Complaint  Patient presents with  . Abscess   The history is provided by the patient. No language interpreter was used.   HPI Comments: Jessica Fitzpatrick is a 19 y.o. female who presents to the Emergency Department complaining of recurrent boil to the inner right buttock onset 3 days ago. She states that it had previously went away but came back. She currently rates her pain 10/10 and reports worsening pain with palpation to the area. She reports hx of similar symptoms and surgery to the bilateral axillas. Pt denies taking anything for her symptoms at home. She denies drainage from the area, fever, chills, diaphoresis, nausea, or vomiting. She denies recent antibiotic use or hx of immunocompromised state.    Past Medical History  Diagnosis Date  . Asthma Jan 2013    no occurences since then  . Headache(784.0)     associated with menses  . 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 Incision and drainage abscess Bilateral 05/10/2013    Procedure: INCISION AND DRAINAGE of bilateral axillary hydradenitis;  Surgeon: Leighton Ruff, MD;  Location: [facility];  Service: General;  Laterality: Bilateral;   Family History  Problem Relation Age of Onset  . Cancer Maternal Aunt     Breast. diagnosed young  . Cancer Maternal Grandmother     Breast  . Lupus Brother    Social History  Substance Use Topics  . Smoking status: Former Smoker    Quit date: 05/02/2013  . Smokeless tobacco: None     Comment: mom smokes  . Alcohol Use: No   OB History    Gravida Para Term Preterm AB TAB SAB Ectopic Multiple Living   0 0 0 0 0 0 0 0 0 0      Review of Systems   Constitutional: Negative for fever, chills, diaphoresis, activity change, appetite change and fatigue.  HENT: Negative.   Eyes: Negative.   Respiratory: Negative.   Cardiovascular: Negative.   Gastrointestinal: Positive for rectal pain. Negative for nausea, vomiting, abdominal pain, diarrhea, constipation, blood in stool, abdominal distention and anal bleeding.  Genitourinary: Negative.   Musculoskeletal: Negative.   Skin: Positive for wound. Negative for color change, pallor and rash.  Neurological: Negative.   Psychiatric/Behavioral: Negative.   All other systems reviewed and are negative.  Allergies  Ibuprofen  Home Medications   Filed Vitals:   07/26/15 2036  BP: 105/67  Pulse: 78  Temp: 98.7 F (37.1 C)  Resp: 16     Prior to Admission medications   Medication Sig Start Date End Date Taking? Authorizing Provider  albuterol (PROVENTIL HFA;VENTOLIN HFA) 108 (90 BASE) MCG/ACT inhaler Inhale 2 puffs into the lungs every 6 (six) hours as needed for wheezing or shortness of breath.   Yes Historical Provider, MD  naproxen (NAPROSYN) 500 MG tablet Take 1 tablet (500 mg total) by mouth 2 (two) times daily with a meal. Patient taking differently: Take 500 mg by mouth 2 (two) times daily as needed for mild pain or moderate pain.  07/17/15  Yes Margarita Mail, PA-C  traMADol (ULTRAM) 50 MG tablet Take 1 tablet (50 mg total) by mouth every 6 (six)  hours as needed. Patient taking differently: Take 50 mg by mouth every 6 (six) hours as needed for moderate pain or severe pain.  07/17/15  Yes Margarita Mail, PA-C  amoxicillin-clavulanate (AUGMENTIN) 875-125 MG tablet Take 1 tablet by mouth every 12 (twelve) hours. 07/26/15   Delsa Grana, PA-C  Levonorgestrel-Ethinyl Estradiol (SEASONIQUE) 0.15-0.03 &0.01 MG tablet Take 1 tablet by mouth daily. Patient not taking: Reported on 07/24/2015 06/12/15   Lezlie Lye, NP  oxyCODONE-acetaminophen (PERCOCET) 5-325 MG tablet Take 1 tablet by mouth  every 4 (four) hours as needed. 07/26/15   Delsa Grana, PA-C   BP 114/74 mmHg  Pulse 97  Temp(Src) 98.3 F (36.8 C) (Oral)  Resp 15  SpO2 100%  LMP 07/24/2015 (Exact Date)  Physical Exam  Constitutional: She is oriented to person, place, and time. Vital signs are normal. She appears well-developed and well-nourished. No distress.  Pt appears uncomfortable, unable to sit, minimally cooperative with answering question, minimal eye contact  HENT:  Head: Normocephalic and atraumatic.  Nose: Nose normal.  Mouth/Throat: Oropharynx is clear and moist and mucous membranes are normal. No oropharyngeal exudate.  Dry lips, moist oral mucosa  Eyes: Conjunctivae, EOM and lids are normal. Pupils are equal, round, and reactive to light. Right eye exhibits no discharge. Left eye exhibits no discharge. No scleral icterus.  Neck: Normal range of motion. Neck supple. No JVD present. No tracheal deviation present. No thyromegaly present.  Cardiovascular: Normal rate, regular rhythm, normal heart sounds and intact distal pulses.  Exam reveals no gallop and no friction rub.   No murmur heard. Pulmonary/Chest: Effort normal and breath sounds normal. No respiratory distress. She has no decreased breath sounds. She has no wheezes. She has no rhonchi. She has no rales. She exhibits no tenderness.  Abdominal: Soft. Normal appearance and bowel sounds are normal. She exhibits no distension and no mass. There is no tenderness. There is no rigidity, no rebound, no guarding and no CVA tenderness.  Genitourinary: Vagina normal. Pelvic exam was performed with patient in the knee-chest position.  Large tender fluctuant abscess located to the right of rectum Pt unable to tolerate rectal exam  Musculoskeletal: Normal range of motion. She exhibits no edema or tenderness.  Lymphadenopathy:    She has no cervical adenopathy.  Neurological: She is alert and oriented to person, place, and time. She has normal reflexes. No  cranial nerve deficit. She exhibits normal muscle tone. Coordination normal.  Skin: Skin is warm and dry. No rash noted. She is not diaphoretic. No erythema. No pallor. Nails show no clubbing.  Psychiatric: Her behavior is normal. Judgment and thought content normal. Her affect is not blunt. Her speech is not rapid and/or pressured. Cognition and memory are normal.  Nursing note and vitals reviewed.   ED Course  Procedures (including critical care time) DIAGNOSTIC STUDIES: Oxygen Saturation is 100% on RA, normal by my interpretation.    COORDINATION OF CARE: 5:43 PM-Discussed treatment plan which includes I&D, Fentanyl, labs and CT scan with pt at bedside and pt agreed to plan.   INCISION AND DRAINAGE PROCEDURE NOTE: Patient identification was confirmed and verbal consent was obtained. This procedure was performed by Delsa Grana, PA-C at 10:58 PM. Site: inner right buttock Sterile procedures observed Needle size: 27 g needle Anesthetic used (type and amt): lidocaine 2% with epi Blade size: 11 Drainage: copious purulent discharge Complexity: Complex Packing used 1/4" iodoform Site anesthetized, incision made over site, wound drained and explored loculations, rinsed with copious amounts  of normal saline, wound packed with sterile gauze, covered with dry, sterile dressing.  Pt tolerated procedure well without complications.  Instructions for care discussed verbally and pt provided with additional written instructions for homecare and f/u.  Labs Review Labs Reviewed  CBC WITH DIFFERENTIAL/PLATELET - Abnormal; Notable for the following:    RBC 3.56 (*)    Hemoglobin 11.4 (*)    HCT 33.0 (*)    All other components within normal limits  BASIC METABOLIC PANEL  I-STAT BETA HCG BLOOD, ED ([facility], [facility], [facility] ONLY)    Imaging Review Ct Abdomen Pelvis W Contrast  07/26/2015  CLINICAL DATA:  Perirectal abscess. EXAM: CT ABDOMEN AND PELVIS WITH CONTRAST TECHNIQUE: Multidetector CT imaging of  the abdomen and pelvis was performed using the standard protocol following bolus administration of intravenous contrast. CONTRAST:  177mL OMNIPAQUE IOHEXOL 300 MG/ML  SOLN COMPARISON:  None. FINDINGS: Lower chest and abdominal wall: There is a 23 x 13 x 18 mm rim enhancing fluid collection with surrounding cellulitic changes in the right buttock which appears below and discrete from the anus. Certainly no supralevator involvement. There is associated avidly enhancing an asymmetrically enlarged right inguinal lymph nodes. Hepatobiliary: No focal liver abnormality.No evidence of biliary obstruction or stone. Pancreas: Unremarkable. Spleen: Unremarkable. Adrenals/Urinary Tract: Negative adrenals. No hydronephrosis or stone. Unremarkable bladder. Reproductive:The uterine fundus is displaced to the left but there appears to be 2 horns and no suspected unicornuate uterus. There is an enhancing mass in the right fundic region, intramural, 21 mm in maximal diameter. There is also a 97mm left cornual region mass best seen on coronal reformats, with mild endometrial distortion. Negative ovaries. Tampon noted. Stomach/Bowel:  No obstruction. No appendicitis. Vascular/Lymphatic: No acute vascular abnormality. No mass or adenopathy. Peritoneal: Trace pelvic fluid, considered physiologic. Musculoskeletal: No acute abnormalities. IMPRESSION: 1. 23 x 13 x 18 mm right buttock abscess along the low intergluteal cleft. No supralevator extension. 2. Fibroid uterus. Electronically Signed   By: Monte Fantasia M.D.   On: 07/26/2015 21:43   I have personally reviewed and evaluated these images and lab results as part of my medical decision-making.   EKG Interpretation None      MDM   Final diagnoses:  Abscess   Pt with rectal vs perirectal abscess, unable to evaluate rectum for perianal fistula or masses.  Pt has dry lips, ash color, and is minimally cooperative during history and physical.  She was given intranasal fentanyl  to see if she would be able to tolerate exam, however this was not successful.  Will need to CT pelvis to evaluate extent of abscess/infection.  Basic labs, IV, and fluids ordered. She was transferred from fast track area to the main ED.  CT ordered.  CT of pelvis ordered Ct shows abscess limited to right low intergluteal cleft I&D indicated. Pt was given an IV dose of pain meds, risk/benefit of I&D explained to pt, who consented to procedure.  I&D successful with copious purulent discharge, was packed with 1/4" iodoform.  Wound care and return precautions reviewed with the pt.  She was discharged home with abx, pain meds and NSAIDS.  Wound care, dressing changes and sitz bath explained to pt.  Pt discharged home in satisfactory and stable condition.  Filed Vitals:   07/26/15 1618 07/26/15 2036 07/26/15 2300  BP: 114/74 105/67 116/64  Pulse: 97 78 78  Temp: 98.3 F (36.8 C) 98.7 F (37.1 C) 98.5 F (36.9 C)  TempSrc: Oral Oral Oral  Resp: 15  16 16  SpO2: 100% 100% 100%   Medications  lidocaine-EPINEPHrine (XYLOCAINE W/EPI) 2 %-1:200000 (PF) injection 20 mL (20 mLs Intradermal Given 07/26/15 1702)  fentaNYL (SUBLIMAZE) injection 100 mcg (100 mcg Nasal Given 07/26/15 1806)  sodium chloride 0.9 % bolus 1,000 mL (0 mLs Intravenous Stopped 07/26/15 2034)  ondansetron (ZOFRAN) injection 4 mg (4 mg Intravenous Given 07/26/15 1855)  iohexol (OMNIPAQUE) 300 MG/ML solution 100 mL (100 mLs Intravenous Contrast Given 07/26/15 2116)  lidocaine-EPINEPHrine (XYLOCAINE W/EPI) 2 %-1:200000 (PF) injection 20 mL (20 mLs Intradermal Given 07/26/15 2208)  HYDROmorphone (DILAUDID) injection 1 mg (1 mg Intravenous Given 07/26/15 2208)  sodium chloride 0.9 % bolus 500 mL (0 mLs Intravenous Stopped 07/26/15 2258)  amoxicillin-clavulanate (AUGMENTIN) 875-125 MG per tablet 1 tablet (1 tablet Oral Given 07/26/15 2258)  ondansetron (ZOFRAN-ODT) disintegrating tablet 4 mg (4 mg Oral Given 07/26/15 2258)   I  personally performed the services described in this documentation, which was scribed in my presence. The recorded information has been reviewed and is accurate.       Delsa Grana, PA-C 07/26/15 [place], DO 07/27/15 0000

## 2015-07-26 NOTE — ED Notes (Signed)
Patient transported to CT 

## 2015-07-26 NOTE — Discharge Instructions (Signed)
Abscess An abscess is an infected area that contains a collection of pus and debris.It can occur in almost any part of the body. An abscess is also known as a furuncle or boil. CAUSES  An abscess occurs when tissue gets infected. This can occur from blockage of oil or sweat glands, infection of hair follicles, or a minor injury to the skin. As the body tries to fight the infection, pus collects in the area and creates pressure under the skin. This pressure causes pain. People with weakened immune systems have difficulty fighting infections and get certain abscesses more often.  SYMPTOMS Usually an abscess develops on the skin and becomes a painful mass that is red, warm, and tender. If the abscess forms under the skin, you may feel a moveable soft area under the skin. Some abscesses break open (rupture) on their own, but most will continue to get worse without care. The infection can spread deeper into the body and eventually into the bloodstream, causing you to feel ill.  DIAGNOSIS  Your caregiver will take your medical history and perform a physical exam. A sample of fluid may also be taken from the abscess to determine what is causing your infection. TREATMENT  Your caregiver may prescribe antibiotic medicines to fight the infection. However, taking antibiotics alone usually does not cure an abscess. Your caregiver may need to make a small cut (incision) in the abscess to drain the pus. In some cases, gauze is packed into the abscess to reduce pain and to continue draining the area. HOME CARE INSTRUCTIONS   Only take over-the-counter or prescription medicines for pain, discomfort, or fever as directed by your caregiver.  If you were prescribed antibiotics, take them as directed. Finish them even if you start to feel better.  If gauze is used, follow your caregiver's directions for changing the gauze.  To avoid spreading the infection:  Keep your draining abscess covered with a  bandage.  Wash your hands well.  Do not share personal care items, towels, or whirlpools with others.  Avoid skin contact with others.  Keep your skin and clothes clean around the abscess.  Keep all follow-up appointments as directed by your caregiver. SEEK MEDICAL CARE IF:   You have increased pain, swelling, redness, fluid drainage, or bleeding.  You have muscle aches, chills, or a general ill feeling.  You have a fever. MAKE SURE YOU:  1. Understand these instructions. 2. Will watch your condition. 3. Will get help right away if you are not doing well or get worse.   This information is not intended to replace advice given to you by your health care provider. Make sure you discuss any questions you have with your health care provider.   Document Released: 06/09/2005 Document Revised: 02/29/2012 Document Reviewed: 11/12/2011 Elsevier Interactive Patient Education 2016 [employer]  Incision and Drainage Incision and drainage is a procedure in which a sac-like structure (cystic structure) is opened and drained. The area to be drained usually contains material such as pus, fluid, or blood.  LET YOUR CAREGIVER KNOW ABOUT:   Allergies to medicine.  Medicines taken, including vitamins, herbs, eyedrops, over-the-counter medicines, and creams.  Use of steroids (by mouth or creams).  Previous problems with anesthetics or numbing medicines.  History of bleeding problems or blood clots.  Previous surgery.  Other health problems, including diabetes and kidney problems.  Possibility of pregnancy, if this applies. RISKS AND COMPLICATIONS  Pain.  Bleeding.  Scarring.  Infection. BEFORE THE PROCEDURE  You may need to have an ultrasound or other imaging tests to see how large or deep your cystic structure is. Blood tests may also be used to determine if you have an infection or how severe the infection is. You may need to have a tetanus shot. PROCEDURE  The affected  area is cleaned with a cleaning fluid. The cyst area will then be numbed with a medicine (local anesthetic). A small incision will be made in the cystic structure. A syringe or catheter may be used to drain the contents of the cystic structure, or the contents may be squeezed out. The area will then be flushed with a cleansing solution. After cleansing the area, it is often gently packed with a gauze or another wound dressing. Once it is packed, it will be covered with gauze and tape or some other type of wound dressing. AFTER THE PROCEDURE  4. Often, you will be allowed to go home right after the procedure. 5. You may be given antibiotic medicine to prevent or heal an infection. 6. If the area was packed with gauze or some other wound dressing, you will likely need to come back in 1 to 2 days to get it removed. 7. The area should heal in about 14 days.   This information is not intended to replace advice given to you by your health care provider. Make sure you discuss any questions you have with your health care provider.   Document Released: 02/23/2001 Document Revised: 02/29/2012 Document Reviewed: 10/25/2011 Elsevier Interactive Patient Education 2016 [employer].  Wound Packing Wound packing involves placing a moistened packing material into your wound and then covering it with an outer bandage (dressing). This helps promote proper healing of deep tissue and tissue under the skin. It also helps prevent bleeding, infection, and further injury.  Wounds are packed until deep tissue has had time to heal. The time it takes for this to occur is different for everyone. Your health care provider will show you how to pack and dress your wound. Using gloves and a sterile technique is important in order to avoid spreading germs into your wound. RISKS AND COMPLICATIONS  Infection.  Delayed or abnormal healing. HOW TO PACK YOUR WOUND Follow your health care provider's instructions on how often you  need to change dressings and pack your wound. You will likely be asked to change dressings 1-2 times a day. Supplies Needed  Gloves.  Wetting solution.  Clean bowl.  Packing material (gauze or gauze sponges).  Clean towels.  Outer dressing.  Tape.  Cotton balls or cotton-tipped swabs.  Small plastic bag. Preparing Your New Packing Material 8. Make sure your work surface or countertop is clean and disinfected. 9. Wash your hands well with soap and water. 10. Put a clean towel on the counter. 11. Put a clean bowl on the towel. Be sure to only touch the outside of the bowl when handling it. 12. Pour wetting solution into the bowl. 13. Cut your packing material (gauze or sponges) to the right size for your wound. Drop it into the bowl. 14. Cut four tape strips that you will use to seal the outer dressing. 15. Put cotton balls or cotton-tipped swabs on the clean towel. Removing the Old Packing and Dressing 1. Gently remove the old dressing and packing material. 2. Put the removed items into the plastic bag to throw away later. 3. Wash your hands well with soap and water again. Applying New Packing Material and Dressing 1. Put  on your gloves. 2. Squeeze the packing material in the bowl to release excess liquid. The packing material should be moist, but not dripping wet. 3. Gently place the packing material into the wound. Use a cotton ball or cotton-tipped swab to guide it into place, filling all of the space. 4. Dry your fingertips on the towel. 5. Open up your outer dressing supplies and put them on a dry part of the towel. Keep them from getting wet. 6. Place the dressing over the packed wound. 7. Tape the four outer edges of the dressing in place. 8. Remove your gloves. 9. Wash your hands again with soap and water. General Tips  Follow your health care provider's instructions on how tightly to pack the wound. At first, the wound will be packed tightly to help stop bleeding.  As the wound begins to heal inside, you will use less packing material and pack the wound loosely to allow tissue to heal slowly from the inside out.  Keep the dressing clean and dry.  Follow any other instructions given by your health care provider on how to aid healing. This may include applying warm or cold compresses, elevating the affected area, or wearing a compression dressing.  Ask your health care provider about sun exposure and sunscreen when the dressings are no longer needed.  Keep all follow-up visits with your health care provider. This is important. SEEK MEDICAL CARE IF:  You have drainage, redness, swelling, or pain at your wound site.  You notice a bad smell coming from the wound site.  Your pain is not controlled with pain medicine.  Tissue inside your wound changes color from pink to white, yellow, or black.  Your wound changes in size or depth.  You have a fever.  You have shaking chills.  You are having trouble packing your wound.   This information is not intended to replace advice given to you by your health care provider. Make sure you discuss any questions you have with your health care provider.   Document Released: 03/27/2014 Document Reviewed: 03/27/2014 Elsevier Interactive Patient Education [employer].

## 2015-07-26 NOTE — ED Notes (Addendum)
The boil is recurrent. She states is has come back over the past 3 days. She has not tried anything at home. Patient undressing for assessment.

## 2015-07-26 NOTE — ED Notes (Signed)
She c/o abscess at inner right buttock.  She is in no distress.

## 2015-07-28 ENCOUNTER — Emergency Department (HOSPITAL_COMMUNITY)
Admission: EM | Admit: 2015-07-28 | Discharge: 2015-07-28 | Disposition: A | Discharge status: Home / Self Care | Payer: Medicaid Other | Attending: Emergency Medicine | Admitting: Emergency Medicine

## 2015-07-28 ENCOUNTER — Encounter (HOSPITAL_COMMUNITY): Payer: Self-pay | Admitting: Emergency Medicine

## 2015-07-28 DIAGNOSIS — Z4801 Encounter for change or removal of surgical wound dressing: Secondary | ICD-10-CM | POA: Insufficient documentation

## 2015-07-28 DIAGNOSIS — Z79899 Other long term (current) drug therapy: Secondary | ICD-10-CM | POA: Diagnosis not present

## 2015-07-28 DIAGNOSIS — Z5189 Encounter for other specified aftercare: Secondary | ICD-10-CM

## 2015-07-28 DIAGNOSIS — Z87891 Personal history of nicotine dependence: Secondary | ICD-10-CM | POA: Insufficient documentation

## 2015-07-28 DIAGNOSIS — J45909 Unspecified asthma, uncomplicated: Secondary | ICD-10-CM | POA: Diagnosis not present

## 2015-07-28 DIAGNOSIS — Z791 Long term (current) use of non-steroidal anti-inflammatories (NSAID): Secondary | ICD-10-CM | POA: Insufficient documentation

## 2015-07-28 DIAGNOSIS — Z872 Personal history of diseases of the skin and subcutaneous tissue: Secondary | ICD-10-CM | POA: Diagnosis not present

## 2015-07-28 NOTE — ED Provider Notes (Signed)
[name]: [id number]     Arrival date & time 07/28/15  1358 History  By signing my name below, I, Rayna Sexton, attest that this documentation has been prepared under the direction and in the presence of [employer], PA-C. Electronically Signed: Rayna Sexton, ED Scribe. 07/28/2015. 2:47 PM.   Chief Complaint  Patient presents with  . Abscess   The history is provided by the patient. No language interpreter was used.   HPI Comments: Jessica Fitzpatrick is a 19 y.o. female who presents to the Emergency Department for reevaluation and packing removal in a perirectal abscess that was dx on 11/12. Pt was seen 2 days ago for a perirectal abscess and received an I&D before being discharged with abx, pain medications and NSAIDs. Pt denies any new or associated symptoms at this time.   Past Medical History  Diagnosis Date  . Asthma Jan 2013    no occurences since then  . Headache(784.0)     associated with menses  . 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 Incision and drainage abscess Bilateral 05/10/2013    Procedure: INCISION AND DRAINAGE of bilateral axillary hydradenitis;  Surgeon: Leighton Ruff, MD;  Location: [facility];  Service: General;  Laterality: Bilateral;   Family History  Problem Relation Age of Onset  . Cancer Maternal Aunt     Breast. diagnosed young  . Cancer Maternal Grandmother     Breast  . Lupus Brother    Social History  Substance Use Topics  . Smoking status: Former Smoker    Quit date: 05/02/2013  . Smokeless tobacco: None     Comment: mom smokes  . Alcohol Use: No   OB History    Gravida Para Term Preterm AB TAB SAB Ectopic Multiple Living   0 0 0 0 0 0 0 0 0 0      Review of Systems A complete 10 system review of systems was obtained and all systems are negative except as noted in the HPI and PMH.  Allergies  Ibuprofen  Home Medications   Prior to Admission medications   Medication Sig Start Date End Date  Taking? Authorizing Provider  albuterol (PROVENTIL HFA;VENTOLIN HFA) 108 (90 BASE) MCG/ACT inhaler Inhale 2 puffs into the lungs every 6 (six) hours as needed for wheezing or shortness of breath.    Historical Provider, MD  amoxicillin-clavulanate (AUGMENTIN) 875-125 MG tablet Take 1 tablet by mouth every 12 (twelve) hours. 07/26/15   Delsa Grana, PA-C  Levonorgestrel-Ethinyl Estradiol (SEASONIQUE) 0.15-0.03 &0.01 MG tablet Take 1 tablet by mouth daily. Patient not taking: Reported on 07/24/2015 06/12/15   Lezlie Lye, NP  naproxen (NAPROSYN) 500 MG tablet Take 1 tablet (500 mg total) by mouth 2 (two) times daily with a meal. Patient taking differently: Take 500 mg by mouth 2 (two) times daily as needed for mild pain or moderate pain.  07/17/15   Margarita Mail, PA-C  oxyCODONE-acetaminophen (PERCOCET) 5-325 MG tablet Take 1 tablet by mouth every 4 (four) hours as needed. 07/26/15   Delsa Grana, PA-C  traMADol (ULTRAM) 50 MG tablet Take 1 tablet (50 mg total) by mouth every 6 (six) hours as needed. Patient taking differently: Take 50 mg by mouth every 6 (six) hours as needed for moderate pain or severe pain.  07/17/15   Abigail Harris, PA-C   BP 121/75 mmHg  Pulse 69  Temp(Src) 98.4 F (36.9 C) (Oral)  Resp 18  Ht 5\' 4"  (1.626 m)  Wt 135 lb (61.236 kg)  BMI 23.16 kg/m2  SpO2 100%  LMP 07/24/2015 (Exact Date)   Physical Exam  Constitutional: She is oriented to person, place, and time. She appears well-developed and well-nourished.  HENT:  Head: Normocephalic and atraumatic.  Mouth/Throat: No oropharyngeal exudate.  Neck: Normal range of motion. No tracheal deviation present.  Cardiovascular: Normal rate.   Pulmonary/Chest: Effort normal. No respiratory distress.  Abdominal: Soft. There is no tenderness.  Musculoskeletal: Normal range of motion.  Neurological: She is alert and oriented to person, place, and time.  Skin: Skin is warm and dry. She is not diaphoretic.  Packed abscess  in right medial gluteus with no signs of surrounding cellulitis; minimal drainage on packing  Psychiatric: She has a normal mood and affect. Her behavior is normal.  Nursing note and vitals reviewed.  ED Course  Procedures  DIAGNOSTIC STUDIES: Oxygen Saturation is 100% on RA, normal by my interpretation.    COORDINATION OF CARE: 2:43 PM Pt presents today for reevaluation of a perirectal abscess and packing removal. At-home recommendations noted for wound management as well as continued use of prescribed medications from prior visit. Return precautions noted. Pt agreed to plan.  Labs Review Labs Reviewed - No data to display  Imaging Review Ct Abdomen Pelvis W Contrast  07/26/2015  CLINICAL DATA:  Perirectal abscess. EXAM: CT ABDOMEN AND PELVIS WITH CONTRAST TECHNIQUE: Multidetector CT imaging of the abdomen and pelvis was performed using the standard protocol following bolus administration of intravenous contrast. CONTRAST:  188mL OMNIPAQUE IOHEXOL 300 MG/ML  SOLN COMPARISON:  None. FINDINGS: Lower chest and abdominal wall: There is a 23 x 13 x 18 mm rim enhancing fluid collection with surrounding cellulitic changes in the right buttock which appears below and discrete from the anus. Certainly no supralevator involvement. There is associated avidly enhancing an asymmetrically enlarged right inguinal lymph nodes. Hepatobiliary: No focal liver abnormality.No evidence of biliary obstruction or stone. Pancreas: Unremarkable. Spleen: Unremarkable. Adrenals/Urinary Tract: Negative adrenals. No hydronephrosis or stone. Unremarkable bladder. Reproductive:The uterine fundus is displaced to the left but there appears to be 2 horns and no suspected unicornuate uterus. There is an enhancing mass in the right fundic region, intramural, 21 mm in maximal diameter. There is also a 44mm left cornual region mass best seen on coronal reformats, with mild endometrial distortion. Negative ovaries. Tampon noted.  Stomach/Bowel:  No obstruction. No appendicitis. Vascular/Lymphatic: No acute vascular abnormality. No mass or adenopathy. Peritoneal: Trace pelvic fluid, considered physiologic. Musculoskeletal: No acute abnormalities. IMPRESSION: 1. 23 x 13 x 18 mm right buttock abscess along the low intergluteal cleft. No supralevator extension. 2. Fibroid uterus. Electronically Signed   By: Monte Fantasia M.D.   On: 07/26/2015 21:43    EKG Interpretation None     MDM   Final diagnoses:  Wound check, abscess   Labs: none indicated  Imaging: none indicated  Consults: none  Therapeutics:   Assessment:  Plan: Patient presents with wound recheck, wound healing well, no significant drainage, no signs of cellulitis. She is instructed to continue wound care, follow-up if new worsening symptoms present. Patient verbalized understanding and agreed to today's plan and had no further questions or concerns at the time of discharge.  I personally performed the services described in this documentation, which was scribed in my presence. The recorded information has been reviewed and is accurate.     Okey Regal, PA-C 07/28/15 [id number]  Sherwood Gambler, MD 07/30/15 [phone]

## 2015-07-28 NOTE — ED Notes (Signed)
PA at bedside.

## 2015-07-28 NOTE — ED Notes (Signed)
Bed: WA29 Expected date:  Expected time:  Means of arrival:  Comments: Ems-ankle pain

## 2015-07-28 NOTE — ED Notes (Signed)
Patient reports that she was seen here 2 days ago and told to follow up here about her abscess on her right buttocks. She was told to come back today to see if it needed to be repacked.

## 2015-07-28 NOTE — Discharge Instructions (Signed)
Please read attached information. If you experience any new or worsening signs or symptoms please return to the emergency room for evaluation. Please follow-up with your primary care provider or specialist as discussed. Please use medication prescribed only as directed and discontinue taking if you have any concerning signs or symptoms.   °

## 2015-09-19 ENCOUNTER — Emergency Department (HOSPITAL_COMMUNITY)
Admission: EM | Admit: 2015-09-19 | Discharge: 2015-09-19 | Disposition: A | Discharge status: Home / Self Care | Payer: Medicaid Other | Attending: Physician Assistant | Admitting: Physician Assistant

## 2015-09-19 ENCOUNTER — Encounter (HOSPITAL_COMMUNITY): Payer: Self-pay | Admitting: Family Medicine

## 2015-09-19 ENCOUNTER — Emergency Department (HOSPITAL_COMMUNITY): Payer: Medicaid Other

## 2015-09-19 DIAGNOSIS — J45909 Unspecified asthma, uncomplicated: Secondary | ICD-10-CM | POA: Diagnosis not present

## 2015-09-19 DIAGNOSIS — Y9289 Other specified places as the place of occurrence of the external cause: Secondary | ICD-10-CM | POA: Insufficient documentation

## 2015-09-19 DIAGNOSIS — M546 Pain in thoracic spine: Secondary | ICD-10-CM | POA: Diagnosis present

## 2015-09-19 DIAGNOSIS — Y999 Unspecified external cause status: Secondary | ICD-10-CM | POA: Diagnosis not present

## 2015-09-19 DIAGNOSIS — R059 Cough, unspecified: Secondary | ICD-10-CM

## 2015-09-19 DIAGNOSIS — Z87891 Personal history of nicotine dependence: Secondary | ICD-10-CM | POA: Insufficient documentation

## 2015-09-19 DIAGNOSIS — X58XXXA Exposure to other specified factors, initial encounter: Secondary | ICD-10-CM | POA: Diagnosis not present

## 2015-09-19 DIAGNOSIS — Z872 Personal history of diseases of the skin and subcutaneous tissue: Secondary | ICD-10-CM | POA: Insufficient documentation

## 2015-09-19 DIAGNOSIS — R05 Cough: Secondary | ICD-10-CM

## 2015-09-19 DIAGNOSIS — Y9389 Activity, other specified: Secondary | ICD-10-CM | POA: Diagnosis not present

## 2015-09-19 DIAGNOSIS — S29012A Strain of muscle and tendon of back wall of thorax, initial encounter: Secondary | ICD-10-CM | POA: Diagnosis not present

## 2015-09-19 DIAGNOSIS — Z792 Long term (current) use of antibiotics: Secondary | ICD-10-CM | POA: Insufficient documentation

## 2015-09-19 DIAGNOSIS — T148XXA Other injury of unspecified body region, initial encounter: Secondary | ICD-10-CM

## 2015-09-19 MED ORDER — CYCLOBENZAPRINE HCL 10 MG PO TABS: 10.0000 mg | ORAL_TABLET | Freq: Two times a day (BID) | ORAL | Status: DC | PRN

## 2015-09-19 NOTE — ED Notes (Signed)
Pt here for left upper back pain that radiates into shoulder and clavicle. sts started last night after coughing. sts hurts when he moves and breathes.

## 2015-09-19 NOTE — Discharge Instructions (Signed)
RICE for Routine Care of Injuries Theroutine careofmanyinjuriesincludes rest, ice, compression, and elevation (RICE therapy). RICE therapy is often recommended for injuries to soft tissues, such as a muscle strain, ligament injuries, bruises, and overuse injuries. It can also be used for some bony injuries. Using RICE therapy can help to relieve pain, lessen swelling, and enable your body to heal. Rest Rest is required to allow your body to heal. This usually involves reducing your normal activities and avoiding use of the injured part of your body. Generally, you can return to your normal activities when you are comfortable and have been given permission by your health care provider. Ice Icing your injury helps to keep the swelling down, and it lessens pain. Do not apply ice directly to your skin.  Put ice in a plastic bag.  Place a towel between your skin and the bag.  Leave the ice on for 20 minutes, 2-3 times a day. Do this for as long as you are directed by your health care provider. Compression Compression means putting pressure on the injured area. Compression helps to keep swelling down, gives support, and helps with discomfort. Compression may be done with an elastic bandage. If an elastic bandage has been applied, follow these general tips:  Remove and reapply the bandage every 3-4 hours or as directed by your health care provider.  Make sure the bandage is not wrapped too tightly, because this can cut off circulation. If part of your body beyond the bandage becomes blue, numb, cold, swollen, or more painful, your bandage is most likely too tight. If this occurs, remove your bandage and reapply it more loosely.  See your health care provider if the bandage seems to be making your problems worse rather than better. Elevation Elevation means keeping the injured area raised. This helps to lessen swelling and decrease pain. If possible, your injured area should be elevated at or  above the level of your heart or the center of your chest. [place]? You should seek medical care if:  Your pain and swelling continue.  Your symptoms are getting worse rather than improving. These symptoms may indicate that further evaluation or further X-rays are needed. Sometimes, X-rays may not show a small broken bone (fracture) until a number of days later. Make a follow-up appointment with your health care provider. WHEN SHOULD I SEEK IMMEDIATE MEDICAL CARE? You should seek immediate medical care if:  You have sudden severe pain at or below the area of your injury.  You have redness or increased swelling around your injury.  You have tingling or numbness at or below the area of your injury that does not improve after you remove the elastic bandage.   This information is not intended to replace advice given to you by your health care provider. Make sure you discuss any questions you have with your health care provider.   Document Released: 12/12/2000 Document Revised: 05/21/2015 Document Reviewed: 08/07/2014 Elsevier Interactive Patient Education 2016 [employer]. Muscle Strain A muscle strain is an injury that occurs when a muscle is stretched beyond its normal length. Usually a small number of muscle fibers are torn when this happens. Muscle strain is rated in degrees. First-degree strains have the least amount of muscle fiber tearing and pain. Second-degree and third-degree strains have increasingly more tearing and pain.  Usually, recovery from muscle strain takes 1-2 weeks. Complete healing takes 5-6 weeks.  CAUSES  Muscle strain happens when a sudden, violent force placed  on a muscle stretches it too far. This may occur with lifting, sports, or a fall.  RISK FACTORS Muscle strain is especially common in athletes.  SIGNS AND SYMPTOMS At the site of the muscle strain, there may be:  Pain.  Bruising.  Swelling.  Difficulty using the muscle due to  pain or lack of normal function. DIAGNOSIS  Your health care provider will perform a physical exam and ask about your medical history. TREATMENT  Often, the best treatment for a muscle strain is resting, icing, and applying cold compresses to the injured area.  HOME CARE INSTRUCTIONS   Use the PRICE method of treatment to promote muscle healing during the first 2-3 days after your injury. The PRICE method involves:  Protecting the muscle from being injured again.  Restricting your activity and resting the injured body part.  Icing your injury. To do this, put ice in a plastic bag. Place a towel between your skin and the bag. Then, apply the ice and leave it on from 15-20 minutes each hour. After the third day, switch to moist heat packs.  Apply compression to the injured area with a splint or elastic bandage. Be careful not to wrap it too tightly. This may interfere with blood circulation or increase swelling.  Elevate the injured body part above the level of your heart as often as you can.  Only take over-the-counter or prescription medicines for pain, discomfort, or fever as directed by your health care provider.  Warming up prior to exercise helps to prevent future muscle strains. SEEK MEDICAL CARE IF:   You have increasing pain or swelling in the injured area.  You have numbness, tingling, or a significant loss of strength in the injured area. MAKE SURE YOU:   Understand these instructions.  Will watch your condition.  Will get help right away if you are not doing well or get worse.   This information is not intended to replace advice given to you by your health care provider. Make sure you discuss any questions you have with your health care provider.   Document Released: 08/30/2005 Document Revised: 06/20/2013 Document Reviewed: 03/29/2013 Elsevier Interactive Patient Education [employer].

## 2015-09-19 NOTE — ED Provider Notes (Signed)
[name]: [id number]     Arrival date & time 09/19/15  1045 History  By signing my name below, I, Essence Howell, attest that this documentation has been prepared under the direction and in the presence of Montine Circle, PA-C Electronically Signed: Ladene Artist, ED Scribe 09/19/2015 at 12:59 PM.   Chief Complaint  Patient presents with  . Back Pain   The history is provided by the patient. No language interpreter was used.   HPI Comments: Jessica Fitzpatrick is a 20 y.o. female who presents to the Emergency Department with a chief complaint of sudden onset of left upper back pain onset last night when coughing. Pt reports constant left upper back pain that radiates into her left shoulder. She has tried warm showers and stretching with temporary relief. No medications tried PTA. Allergy to ibuprofen.  Past Medical History  Diagnosis Date  . Asthma Jan 2013    no occurences since then  . Headache(784.0)     associated with menses  . 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 Incision and drainage abscess Bilateral 05/10/2013    Procedure: INCISION AND DRAINAGE of bilateral axillary hydradenitis;  Surgeon: Leighton Ruff, MD;  Location: [facility];  Service: General;  Laterality: Bilateral;   Family History  Problem Relation Age of Onset  . Cancer Maternal Aunt     Breast. diagnosed young  . Cancer Maternal Grandmother     Breast  . Lupus Brother    Social History  Substance Use Topics  . Smoking status: Former Smoker    Quit date: 05/02/2013  . Smokeless tobacco: None     Comment: mom smokes  . Alcohol Use: No   OB History    Gravida Para Term Preterm AB TAB SAB Ectopic Multiple Living   0 0 0 0 0 0 0 0 0 0      Review of Systems  Constitutional: Negative for fever and chills.  Respiratory: Negative for shortness of breath.   Cardiovascular: Negative for chest pain.  Gastrointestinal: Negative for nausea, vomiting, diarrhea and constipation.   Genitourinary: Negative for dysuria.  Musculoskeletal: Positive for back pain and arthralgias.   Allergies  Ibuprofen  Home Medications   Prior to Admission medications   Medication Sig Start Date End Date Taking? Authorizing Provider  albuterol (PROVENTIL HFA;VENTOLIN HFA) 108 (90 BASE) MCG/ACT inhaler Inhale 2 puffs into the lungs every 6 (six) hours as needed for wheezing or shortness of breath.    Historical Provider, MD  amoxicillin-clavulanate (AUGMENTIN) 875-125 MG tablet Take 1 tablet by mouth every 12 (twelve) hours. 07/26/15   Delsa Grana, PA-C  Levonorgestrel-Ethinyl Estradiol (SEASONIQUE) 0.15-0.03 &0.01 MG tablet Take 1 tablet by mouth daily. Patient not taking: Reported on 07/24/2015 06/12/15   Lezlie Lye, NP  naproxen (NAPROSYN) 500 MG tablet Take 1 tablet (500 mg total) by mouth 2 (two) times daily with a meal. Patient taking differently: Take 500 mg by mouth 2 (two) times daily as needed for mild pain or moderate pain.  07/17/15   Margarita Mail, PA-C  oxyCODONE-acetaminophen (PERCOCET) 5-325 MG tablet Take 1 tablet by mouth every 4 (four) hours as needed. 07/26/15   Delsa Grana, PA-C  traMADol (ULTRAM) 50 MG tablet Take 1 tablet (50 mg total) by mouth every 6 (six) hours as needed. Patient taking differently: Take 50 mg by mouth every 6 (six) hours as needed for moderate pain or severe pain.  07/17/15   Margarita Mail, PA-C  BP 122/90 mmHg  Pulse 86  Temp(Src) 98.2 F (36.8 C) (Oral)  Resp 20  SpO2 100% Physical Exam  Constitutional: She is oriented to person, place, and time. She appears well-developed and well-nourished. No distress.  HENT:  Head: Normocephalic and atraumatic.  Eyes: Conjunctivae and EOM are normal. Right eye exhibits no discharge. Left eye exhibits no discharge. No scleral icterus.  Neck: Normal range of motion. Neck supple. No tracheal deviation present.  Cardiovascular: Normal rate, regular rhythm and normal heart sounds.  Exam reveals no  gallop and no friction rub.   No murmur heard. Pulmonary/Chest: Effort normal and breath sounds normal. No respiratory distress. She has no wheezes.  Abdominal: Soft. She exhibits no distension. There is no tenderness.  Musculoskeletal: Normal range of motion.  Left upper paraspinal muscles tender to palpation, no bony tenderness, step-offs, or gross abnormality or deformity of spine, patient is able to ambulate, moves all extremities   Neurological: She is alert and oriented to person, place, and time.  Sensation and strength intact bilaterally   Skin: Skin is warm and dry. She is not diaphoretic.  Psychiatric: She has a normal mood and affect. Her behavior is normal. Judgment and thought content normal.  Nursing note and vitals reviewed.  ED Course  Procedures (including critical care time) DIAGNOSTIC STUDIES: Oxygen Saturation is 100% on RA, normal by my interpretation.    COORDINATION OF CARE: 12:49 PM-Discussed treatment plan which includes CXR and flexeril with pt at bedside and pt agreed to plan.   Labs Review Labs Reviewed - No data to display  Imaging Review Dg Chest 2 View  09/19/2015  CLINICAL DATA:  Upper back and neck pain beginning this morning. Cough. Initial encounter. EXAM: CHEST  2 VIEW COMPARISON:  PA and lateral chest 07/24/2015. FINDINGS: The lungs are clear. Heart size is normal. There is no pneumothorax or pleural effusion. No bony abnormality is identified. IMPRESSION: Normal chest. Electronically Signed   By: Inge Rise M.D.   On: 09/19/2015 11:40   I have personally reviewed and evaluated these images and lab results as part of my medical decision-making.   EKG Interpretation None      MDM   Final diagnoses:  Muscle strain    Patient with intercostal/back muscle strain from coughing fit last night. Chest x-ray is clear, no evidence of pneumothorax, pneumonia, or other injury. PERC negative, doubt PE. Left is paraspinal, rhomboids, and trapezius  muscles are tender to palpation, will treat with muscle relaxer, and recommend follow-up with primary care provider.    I personally performed the services described in this documentation, which was scribed in my presence. The recorded information has been reviewed and is accurate.      Montine Circle, PA-C 09/19/15 1635  Courteney Julio Alm, MD 09/20/15 [id number]

## 2015-12-22 ENCOUNTER — Emergency Department (HOSPITAL_COMMUNITY)
Admission: EM | Admit: 2015-12-22 | Discharge: 2015-12-22 | Disposition: A | Discharge status: Home / Self Care | Payer: Medicaid Other | Attending: Emergency Medicine | Admitting: Emergency Medicine

## 2015-12-22 ENCOUNTER — Encounter (HOSPITAL_COMMUNITY): Payer: Self-pay | Admitting: Emergency Medicine

## 2015-12-22 DIAGNOSIS — Z872 Personal history of diseases of the skin and subcutaneous tissue: Secondary | ICD-10-CM | POA: Insufficient documentation

## 2015-12-22 DIAGNOSIS — M25512 Pain in left shoulder: Secondary | ICD-10-CM | POA: Diagnosis not present

## 2015-12-22 DIAGNOSIS — J45909 Unspecified asthma, uncomplicated: Secondary | ICD-10-CM | POA: Insufficient documentation

## 2015-12-22 DIAGNOSIS — M62838 Other muscle spasm: Secondary | ICD-10-CM

## 2015-12-22 DIAGNOSIS — M546 Pain in thoracic spine: Secondary | ICD-10-CM | POA: Diagnosis present

## 2015-12-22 DIAGNOSIS — Z79899 Other long term (current) drug therapy: Secondary | ICD-10-CM | POA: Insufficient documentation

## 2015-12-22 DIAGNOSIS — Z87891 Personal history of nicotine dependence: Secondary | ICD-10-CM | POA: Diagnosis not present

## 2015-12-22 MED ORDER — ACETAMINOPHEN 325 MG PO TABS
650.0000 mg | ORAL_TABLET | Freq: Once | ORAL | Status: AC
  Administered 2015-12-22: 650 mg via ORAL
  Filled 2015-12-22: qty 2

## 2015-12-22 MED ORDER — DEXAMETHASONE SODIUM PHOSPHATE 10 MG/ML IJ SOLN
10.0000 mg | Freq: Once | INTRAMUSCULAR | Status: AC
  Administered 2015-12-22: 10 mg via INTRAMUSCULAR
  Filled 2015-12-22: qty 1

## 2015-12-22 MED ORDER — METHOCARBAMOL 500 MG PO TABS
500.0000 mg | ORAL_TABLET | Freq: Once | ORAL | Status: AC
Start: 2015-12-22 — End: 2015-12-22
  Administered 2015-12-22: 500 mg via ORAL
  Filled 2015-12-22: qty 1

## 2015-12-22 MED ORDER — METHOCARBAMOL 500 MG PO TABS: 500.0000 mg | ORAL_TABLET | Freq: Two times a day (BID) | ORAL | Status: DC

## 2015-12-22 NOTE — Discharge Instructions (Signed)
I will give you a prescription for Robaxin, a different kind of muscle relaxer, to see if it works better than what you have tried in the past. We also gave you a shot of steroids in the emergency room. Take tylenol as needed for pain. Continue warm compresses. Please call your primary care provider to schedule a follow up appointment within the next week. Return to the ER for new or worsening symptoms.

## 2015-12-22 NOTE — ED Provider Notes (Signed)
[name]: [id number]     Arrival date & time 12/22/15  1419 History  By signing my name below, I, Rayna Sexton, attest that this documentation has been prepared under the direction and in the presence of Tanaisha Pittman Y Zylee Marchiano, [place]. Electronically Signed: Rayna Sexton, ED Scribe. 12/22/2015. 2:47 PM.    Chief Complaint  Patient presents with  . Spasms   The history is provided by the patient. No language interpreter was used.    HPI Comments: Jessica Fitzpatrick is a 20 y.o. female who presents to the Emergency Department complaining of waxing and waning, moderate, left, mid-upper back pain onset 3 months ago. She notes being seen for similar symptoms in the past and was d/c with rx muscle relaxers and advised to rest which provided short term relief of her symptoms before they worsened once again. Her pain worsens with movement. She denies taking any new medications for pain management. She denies numbness, weakness or any other associated symptoms at this time.    Past Medical History  Diagnosis Date  . Asthma Jan 2013    no occurences since then  . Headache(784.0)     associated with menses  . 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 Incision and drainage abscess Bilateral 05/10/2013    Procedure: INCISION AND DRAINAGE of bilateral axillary hydradenitis;  Surgeon: Leighton Ruff, MD;  Location: [facility];  Service: General;  Laterality: Bilateral;   Family History  Problem Relation Age of Onset  . Cancer Maternal Aunt     Breast. diagnosed young  . Cancer Maternal Grandmother     Breast  . Lupus Brother    Social History  Substance Use Topics  . Smoking status: Former Smoker    Quit date: 05/02/2013  . Smokeless tobacco: None     Comment: mom smokes  . Alcohol Use: No   OB History    Gravida Para Term Preterm AB TAB SAB Ectopic Multiple Living   0 0 0 0 0 0 0 0 0 0      Review of Systems A complete 10 system review of systems was obtained  and all systems are negative except as noted in the HPI and PMH.    Allergies  Ibuprofen  Home Medications   Prior to Admission medications   Medication Sig Start Date End Date Taking? Authorizing Provider  albuterol (PROVENTIL HFA;VENTOLIN HFA) 108 (90 BASE) MCG/ACT inhaler Inhale 2 puffs into the lungs every 6 (six) hours as needed for wheezing or shortness of breath.    Historical Provider, MD  amoxicillin-clavulanate (AUGMENTIN) 875-125 MG tablet Take 1 tablet by mouth every 12 (twelve) hours. 07/26/15   Delsa Grana, PA-C  cyclobenzaprine (FLEXERIL) 10 MG tablet Take 1 tablet (10 mg total) by mouth 2 (two) times daily as needed for muscle spasms. 09/19/15   Montine Circle, PA-C  Levonorgestrel-Ethinyl Estradiol (SEASONIQUE) 0.15-0.03 &0.01 MG tablet Take 1 tablet by mouth daily. Patient not taking: Reported on 07/24/2015 06/12/15   Lezlie Lye, NP  naproxen (NAPROSYN) 500 MG tablet Take 1 tablet (500 mg total) by mouth 2 (two) times daily with a meal. Patient taking differently: Take 500 mg by mouth 2 (two) times daily as needed for mild pain or moderate pain.  07/17/15   Margarita Mail, PA-C  oxyCODONE-acetaminophen (PERCOCET) 5-325 MG tablet Take 1 tablet by mouth every 4 (four) hours as needed. 07/26/15   Delsa Grana, PA-C  traMADol (ULTRAM) 50 MG tablet Take 1 tablet (50  mg total) by mouth every 6 (six) hours as needed. Patient taking differently: Take 50 mg by mouth every 6 (six) hours as needed for moderate pain or severe pain.  07/17/15   Abigail Harris, PA-C   BP 129/94 mmHg  Pulse 77  Temp(Src) 97.9 F (36.6 C) (Oral)  Resp 19  SpO2 99%  LMP 12/17/2015 Physical Exam  Constitutional: She is oriented to person, place, and time. She appears well-developed and well-nourished.  HENT:  Head: Normocephalic and atraumatic.  Eyes: EOM are normal.  Neck: Normal range of motion.  Cardiovascular: Normal rate.   Pulmonary/Chest: Effort normal. No respiratory distress.  Abdominal:  Soft.  Musculoskeletal: Normal range of motion.  Left trapezial ttp and spasm. FROM of neck. Intact strength in bilateral upper extremities.  No midline tenderness. No stepoff or deformity.  Neurological: She is alert and oriented to person, place, and time.  Skin: Skin is warm and dry.  Psychiatric: She has a normal mood and affect.  Nursing note and vitals reviewed.  ED Course  Procedures  DIAGNOSTIC STUDIES: Oxygen Saturation is 99% on RA, normal by my interpretation.    COORDINATION OF CARE: 2:46 PM Discussed next steps with pt. She verbalized understanding and is agreeable with the plan.   Labs Review Labs Reviewed - No data to display  Imaging Review No results found.   EKG Interpretation None      MDM   Final diagnoses:  Muscle spasms of neck   Pt with ongoing muscular pain and spasms in left trapezial area intermittently for the pas three months. No focal neuro deficits. VSS. Decadron given in the ED with rx for robaxin. INstructed to f/u with PCP and ortho. ER return precautions given.   I personally performed the services described in this documentation, which was scribed in my presence. The recorded information has been reviewed and is accurate.    Anne Ng, PA-C 12/24/15 [place], DO 12/26/15 [phone]

## 2015-12-22 NOTE — ED Notes (Signed)
Pt has pain and spasms in neck, back and left shoulder and has had for 3 months.

## 2016-02-25 ENCOUNTER — Encounter (HOSPITAL_COMMUNITY): Payer: Self-pay | Admitting: Emergency Medicine

## 2016-02-25 ENCOUNTER — Emergency Department (HOSPITAL_COMMUNITY)
Admission: EM | Admit: 2016-02-25 | Discharge: 2016-02-25 | Discharge status: Against Medical Advice | Payer: Medicaid Other | Attending: Emergency Medicine | Admitting: Emergency Medicine

## 2016-02-25 ENCOUNTER — Emergency Department (HOSPITAL_COMMUNITY): Payer: Medicaid Other

## 2016-02-25 DIAGNOSIS — Z79899 Other long term (current) drug therapy: Secondary | ICD-10-CM | POA: Insufficient documentation

## 2016-02-25 DIAGNOSIS — R109 Unspecified abdominal pain: Secondary | ICD-10-CM

## 2016-02-25 DIAGNOSIS — R10A0 Flank pain, unspecified side: Secondary | ICD-10-CM

## 2016-02-25 DIAGNOSIS — Z791 Long term (current) use of non-steroidal anti-inflammatories (NSAID): Secondary | ICD-10-CM | POA: Diagnosis not present

## 2016-02-25 DIAGNOSIS — J45909 Unspecified asthma, uncomplicated: Secondary | ICD-10-CM | POA: Insufficient documentation

## 2016-02-25 DIAGNOSIS — Z87891 Personal history of nicotine dependence: Secondary | ICD-10-CM | POA: Insufficient documentation

## 2016-02-25 LAB — URINALYSIS, ROUTINE W REFLEX MICROSCOPIC
BILIRUBIN URINE: NEGATIVE
Glucose, UA: NEGATIVE mg/dL
HGB URINE DIPSTICK: NEGATIVE
Ketones, ur: NEGATIVE mg/dL
Leukocytes, UA: NEGATIVE
Nitrite: NEGATIVE
PH: 6 (ref 5.0–8.0)
Protein, ur: NEGATIVE mg/dL
SPECIFIC GRAVITY, URINE: 1.021 (ref 1.005–1.030)

## 2016-02-25 LAB — PREGNANCY, URINE: Preg Test, Ur: NEGATIVE

## 2016-02-25 NOTE — ED Notes (Signed)
Pt left without signing out or d/c vitals

## 2016-02-25 NOTE — ED Provider Notes (Signed)
[name]: [id number]     Arrival date & time 02/25/16  1754 History   First MD Initiated Contact with Patient 02/25/16 2043     Chief Complaint  Patient presents with  . Flank Pain     (Consider location/radiation/quality/duration/timing/severity/associated sxs/prior Treatment) HPI Comments: Reports 2 days of R flank pain radiating to front mis abdomen made worse with BM denies urinary frequency, vaginal discharge, sexual activity with men last menstrual cycle 2 weeks ago normal .Last BM this morning Has not taken any medication for discomfort   Patient is a 20 y.o. female presenting with flank pain. The history is provided by the patient.  Flank Pain This is a new problem. The current episode started yesterday. The problem occurs intermittently. The problem has been unchanged. Associated symptoms include abdominal pain. Pertinent negatives include no change in bowel habit, chills, coughing, fever, myalgias, nausea, urinary symptoms or vomiting. Exacerbated by: bowel movement and sitting. She has tried nothing for the symptoms. The treatment provided no relief.    Past Medical History  Diagnosis Date  . Asthma Jan 2013    no occurences since then  . Headache(784.0)     associated with menses  . 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 Incision and drainage abscess Bilateral 05/10/2013    Procedure: INCISION AND DRAINAGE of bilateral axillary hydradenitis;  Surgeon: Leighton Ruff, MD;  Location: [facility];  Service: General;  Laterality: Bilateral;   Family History  Problem Relation Age of Onset  . Cancer Maternal Aunt     Breast. diagnosed young  . Cancer Maternal Grandmother     Breast  . Lupus Brother    Social History  Substance Use Topics  . Smoking status: Former Smoker    Quit date: 05/02/2013  . Smokeless tobacco: None     Comment: mom smokes  . Alcohol Use: No   OB History    Gravida Para Term Preterm AB TAB SAB Ectopic  Multiple Living   0 0 0 0 0 0 0 0 0 0      Review of Systems  Constitutional: Negative for fever and chills.  Respiratory: Negative for cough and shortness of breath.   Gastrointestinal: Positive for abdominal pain. Negative for nausea, vomiting, diarrhea, constipation and change in bowel habit.  Genitourinary: Positive for flank pain. Negative for dysuria, frequency, decreased urine volume, vaginal discharge, difficulty urinating, vaginal pain, menstrual problem and pelvic pain.  Musculoskeletal: Negative for myalgias.  All other systems reviewed and are negative.     Allergies  Ibuprofen  Home Medications   Prior to Admission medications   Medication Sig Start Date End Date Taking? Authorizing Provider  LYRICA 150 MG capsule Take 150 mg by mouth 2 (two) times daily. 01/09/16  Yes Historical Provider, MD  meloxicam (MOBIC) 15 MG tablet Take 15 mg by mouth daily. 01/05/16  Yes Historical Provider, MD  tiZANidine (ZANAFLEX) 2 MG tablet Take 2 mg by mouth 2 (two) times daily. 01/05/16  Yes Historical Provider, MD  albuterol (PROVENTIL HFA;VENTOLIN HFA) 108 (90 BASE) MCG/ACT inhaler Inhale 2 puffs into the lungs every 6 (six) hours as needed for wheezing or shortness of breath.    Historical Provider, MD  amoxicillin-clavulanate (AUGMENTIN) 875-125 MG tablet Take 1 tablet by mouth every 12 (twelve) hours. Patient not taking: Reported on 02/25/2016 07/26/15   Delsa Grana, PA-C  cyclobenzaprine (FLEXERIL) 10 MG tablet Take 1 tablet (10 mg total) by mouth 2 (two) times daily as needed  for muscle spasms. Patient not taking: Reported on 02/25/2016 09/19/15   Montine Circle, PA-C  Levonorgestrel-Ethinyl Estradiol (SEASONIQUE) 0.15-0.03 &0.01 MG tablet Take 1 tablet by mouth daily. Patient not taking: Reported on 07/24/2015 06/12/15   Lezlie Lye, NP  methocarbamol (ROBAXIN) 500 MG tablet Take 1 tablet (500 mg total) by mouth 2 (two) times daily. Patient not taking: Reported on 02/25/2016 12/22/15    Olivia Canter Sam, PA-C  naproxen (NAPROSYN) 500 MG tablet Take 1 tablet (500 mg total) by mouth 2 (two) times daily with a meal. Patient not taking: Reported on 02/25/2016 07/17/15   Margarita Mail, PA-C  oxyCODONE-acetaminophen (PERCOCET) 5-325 MG tablet Take 1 tablet by mouth every 4 (four) hours as needed. Patient not taking: Reported on 02/25/2016 07/26/15   Delsa Grana, PA-C  traMADol (ULTRAM) 50 MG tablet Take 1 tablet (50 mg total) by mouth every 6 (six) hours as needed. Patient not taking: Reported on 02/25/2016 07/17/15   Margarita Mail, PA-C   BP 123/97 mmHg  Pulse 90  Temp(Src) 98.8 F (37.1 C)  Resp 14  SpO2 100% Physical Exam  Constitutional: She is oriented to person, place, and time. She appears well-developed and well-nourished.  HENT:  Head: Normocephalic.  Eyes: Pupils are equal, round, and reactive to light.  Neck: Normal range of motion.  Cardiovascular: Normal rate and regular rhythm.   Pulmonary/Chest: Effort normal.  Abdominal: Soft. Bowel sounds are normal. She exhibits no distension. There is no tenderness. There is no rebound.  Musculoskeletal: Normal range of motion.  Neurological: She is alert and oriented to person, place, and time.  Skin: Skin is warm and dry.  Nursing note and vitals reviewed.   ED Course  Procedures (including critical care time) Labs Review Labs Reviewed  URINALYSIS, ROUTINE W REFLEX MICROSCOPIC (NOT AT [facility])  PREGNANCY, URINE  POC URINE PREG, ED    Imaging Review No results found. I have personally reviewed and evaluated these images and lab results as part of my medical decision-making.   EKG Interpretation None     PAtient refused xray stating she had to go This patient's symptoms and verbal history and physical examination, likely cause of this patient's discomfort is constipation, although she did refuse to stay for completion of examination and evaluation MDM   Final diagnoses:  Flank pain         Junius Creamer,  NP 02/25/16 2329  Junius Creamer, NP 02/25/16 [id number]  Isla Pence, MD 02/26/16 [id number]

## 2016-02-25 NOTE — ED Notes (Signed)
Patient here with complaints of right sided flank pain radiating around to back. Reports urinary frequency. Denies n/v/d.

## 2016-02-25 NOTE — ED Notes (Addendum)
Pt ambulated out of the ED. Did not want to wait any longer

## 2016-02-25 NOTE — ED Notes (Signed)
Pt left [facility] RN informed

## 2016-04-07 ENCOUNTER — Encounter: Payer: Self-pay | Admitting: Pediatrics

## 2016-04-08 ENCOUNTER — Encounter: Payer: Self-pay | Admitting: Pediatrics

## 2016-09-03 ENCOUNTER — Emergency Department (HOSPITAL_COMMUNITY): Payer: Medicaid Other

## 2016-09-03 ENCOUNTER — Emergency Department (HOSPITAL_COMMUNITY)
Admission: EM | Admit: 2016-09-03 | Discharge: 2016-09-03 | Disposition: A | Discharge status: Home / Self Care | Payer: Medicaid Other | Attending: Emergency Medicine | Admitting: Emergency Medicine

## 2016-09-03 ENCOUNTER — Encounter (HOSPITAL_COMMUNITY): Payer: Self-pay | Admitting: *Deleted

## 2016-09-03 DIAGNOSIS — Z87891 Personal history of nicotine dependence: Secondary | ICD-10-CM | POA: Diagnosis not present

## 2016-09-03 DIAGNOSIS — Y92410 Unspecified street and highway as the place of occurrence of the external cause: Secondary | ICD-10-CM | POA: Diagnosis not present

## 2016-09-03 DIAGNOSIS — M542 Cervicalgia: Secondary | ICD-10-CM

## 2016-09-03 DIAGNOSIS — R51 Headache: Secondary | ICD-10-CM | POA: Insufficient documentation

## 2016-09-03 DIAGNOSIS — Y939 Activity, unspecified: Secondary | ICD-10-CM | POA: Insufficient documentation

## 2016-09-03 DIAGNOSIS — Z79899 Other long term (current) drug therapy: Secondary | ICD-10-CM | POA: Diagnosis not present

## 2016-09-03 DIAGNOSIS — Y999 Unspecified external cause status: Secondary | ICD-10-CM | POA: Diagnosis not present

## 2016-09-03 DIAGNOSIS — J45909 Unspecified asthma, uncomplicated: Secondary | ICD-10-CM | POA: Insufficient documentation

## 2016-09-03 DIAGNOSIS — M546 Pain in thoracic spine: Secondary | ICD-10-CM | POA: Insufficient documentation

## 2016-09-03 MED ORDER — NAPROXEN 500 MG PO TABS: 500.0000 mg | ORAL_TABLET | Freq: Two times a day (BID) | ORAL | 0 refills | Status: DC

## 2016-09-03 MED ORDER — METHOCARBAMOL 500 MG PO TABS: 500.0000 mg | ORAL_TABLET | Freq: Two times a day (BID) | ORAL | 0 refills | Status: AC

## 2016-09-03 NOTE — Discharge Instructions (Signed)
Your CT scan were negative today.  Your pain is likely from a muscular injury, a spasm or tightness.   You have been prescribed a muscle relaxer (methocarbamol).  This medication can cause drowsiness, please avoid alcohol or driving.  Take naproxen for pain as prescribed.   Please read the attached information on "neck exercises" and perform these at home.   Heat, massage and mild stretches may help with muscle tightness.   Please see the attached results of your CT scan.  Discuss these findings with your primary care provider.

## 2016-09-03 NOTE — ED Notes (Signed)
Patient was alert, oriented and stable upon discharge. RN went over AVS and patient had no further questions. Pt was educated not to drive on muscle relaxers.
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
## 2016-12-27 ENCOUNTER — Ambulatory Visit: Payer: Medicaid Other | Admitting: Obstetrics and Gynecology

## 2017-01-26 ENCOUNTER — Emergency Department (HOSPITAL_COMMUNITY)
Admission: EM | Admit: 2017-01-26 | Discharge: 2017-01-27 | Disposition: A | Discharge status: Home / Self Care | Payer: Medicaid Other | Attending: Emergency Medicine | Admitting: Emergency Medicine

## 2017-01-26 ENCOUNTER — Emergency Department (HOSPITAL_COMMUNITY): Payer: Medicaid Other

## 2017-01-26 ENCOUNTER — Encounter (HOSPITAL_COMMUNITY): Payer: Self-pay

## 2017-01-26 DIAGNOSIS — Y92009 Unspecified place in unspecified non-institutional (private) residence as the place of occurrence of the external cause: Secondary | ICD-10-CM | POA: Diagnosis not present

## 2017-01-26 DIAGNOSIS — Y939 Activity, unspecified: Secondary | ICD-10-CM | POA: Insufficient documentation

## 2017-01-26 DIAGNOSIS — S40811A Abrasion of right upper arm, initial encounter: Secondary | ICD-10-CM | POA: Insufficient documentation

## 2017-01-26 DIAGNOSIS — S43005A Unspecified dislocation of left shoulder joint, initial encounter: Secondary | ICD-10-CM | POA: Diagnosis not present

## 2017-01-26 DIAGNOSIS — Z23 Encounter for immunization: Secondary | ICD-10-CM | POA: Diagnosis not present

## 2017-01-26 DIAGNOSIS — Y999 Unspecified external cause status: Secondary | ICD-10-CM | POA: Insufficient documentation

## 2017-01-26 DIAGNOSIS — S4992XA Unspecified injury of left shoulder and upper arm, initial encounter: Secondary | ICD-10-CM | POA: Diagnosis present

## 2017-01-26 DIAGNOSIS — T148XXA Other injury of unspecified body region, initial encounter: Secondary | ICD-10-CM

## 2017-01-26 LAB — I-STAT BETA HCG BLOOD, ED (MC, WL, AP ONLY)

## 2017-01-26 MED ORDER — AMOXICILLIN-POT CLAVULANATE 875-125 MG PO TABS
1.0000 | ORAL_TABLET | Freq: Once | ORAL | Status: AC
  Administered 2017-01-26: 1 via ORAL
  Filled 2017-01-26: qty 1

## 2017-01-26 MED ORDER — SODIUM CHLORIDE 0.9 % IV BOLUS (SEPSIS)
1000.0000 mL | Freq: Once | INTRAVENOUS | Status: AC
  Administered 2017-01-26: 1000 mL via INTRAVENOUS

## 2017-01-26 MED ORDER — HYDROMORPHONE HCL 1 MG/ML IJ SOLN
1.0000 mg | Freq: Once | INTRAMUSCULAR | Status: AC
  Administered 2017-01-26: 1 mg via INTRAVENOUS
  Filled 2017-01-26: qty 1

## 2017-01-26 MED ORDER — OXYCODONE-ACETAMINOPHEN 5-325 MG PO TABS
ORAL_TABLET | ORAL | Status: AC
  Filled 2017-01-26: qty 1

## 2017-01-26 MED ORDER — OXYCODONE-ACETAMINOPHEN 5-325 MG PO TABS: 1.0000 | ORAL_TABLET | ORAL | Status: DC | PRN

## 2017-01-26 MED ORDER — ETOMIDATE 2 MG/ML IV SOLN
10.0000 mg | Freq: Once | INTRAVENOUS | Status: DC
  Filled 2017-01-26: qty 10

## 2017-01-26 MED ORDER — TETANUS-DIPHTH-ACELL PERTUSSIS 5-2.5-18.5 LF-MCG/0.5 IM SUSP
0.5000 mL | Freq: Once | INTRAMUSCULAR | Status: AC
  Administered 2017-01-26: 0.5 mL via INTRAMUSCULAR
  Filled 2017-01-26: qty 0.5

## 2017-01-26 NOTE — ED Notes (Signed)
Ortho tech made aware to assist with shoulder dislocation

## 2017-01-26 NOTE — ED Notes (Signed)
Patient transported to X-ray 
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
## 2017-01-27 ENCOUNTER — Emergency Department (HOSPITAL_COMMUNITY): Payer: Medicaid Other

## 2017-01-27 MED ORDER — ETOMIDATE 2 MG/ML IV SOLN
INTRAVENOUS | Status: AC | PRN
  Administered 2017-01-27: 10 mg via INTRAVENOUS

## 2017-01-27 MED ORDER — FENTANYL CITRATE (PF) 100 MCG/2ML IJ SOLN
100.0000 ug | Freq: Once | INTRAMUSCULAR | Status: AC
  Administered 2017-01-27: 100 ug via INTRAVENOUS
  Filled 2017-01-27: qty 2

## 2017-01-27 MED ORDER — AMOXICILLIN-POT CLAVULANATE 875-125 MG PO TABS: 1.0000 | ORAL_TABLET | Freq: Two times a day (BID) | ORAL | 0 refills | Status: DC

## 2017-01-27 MED ORDER — PROPOFOL 1000 MG/100ML IV EMUL: 5.0000 ug/kg/min | Freq: Once | INTRAVENOUS | Status: DC

## 2017-01-27 MED ORDER — PROPOFOL 10 MG/ML IV BOLUS
INTRAVENOUS | Status: AC
  Filled 2017-01-27: qty 20

## 2017-01-27 MED ORDER — PROPOFOL 10 MG/ML IV BOLUS
INTRAVENOUS | Status: AC | PRN
  Administered 2017-01-27 (×3): 30 mg via INTRAVENOUS

## 2017-01-27 MED ORDER — OXYCODONE-ACETAMINOPHEN 5-325 MG PO TABS: 1.0000 | ORAL_TABLET | Freq: Three times a day (TID) | ORAL | 0 refills | Status: DC | PRN

## 2017-01-27 MED ORDER — CYCLOBENZAPRINE HCL 10 MG PO TABS: 10.0000 mg | ORAL_TABLET | Freq: Two times a day (BID) | ORAL | 0 refills | Status: DC | PRN

## 2017-01-27 NOTE — ED Notes (Signed)
Pt stable, understands discharge instructions, and reasons for return.   

## 2017-01-27 NOTE — Progress Notes (Signed)
Orthopedic Tech Progress Note Patient Details:  Jessica Fitzpatrick [birth year] [id number]  Ortho Devices Type of Ortho Device: Sling immobilizer Ortho Device/Splint Location: lue Ortho Device/Splint Interventions: Ordered, Application Assisted dr with lue shoulder reduction. Applied sling immobilizer as per drs verbal order.  Karolee Stamps 01/27/2017, 12:39 AM

## 2017-02-01 ENCOUNTER — Encounter (INDEPENDENT_AMBULATORY_CARE_PROVIDER_SITE_OTHER): Payer: Self-pay | Admitting: Orthopaedic Surgery

## 2017-02-01 ENCOUNTER — Ambulatory Visit (INDEPENDENT_AMBULATORY_CARE_PROVIDER_SITE_OTHER): Payer: Medicaid Other | Admitting: Orthopaedic Surgery

## 2017-02-01 DIAGNOSIS — S4292XA Fracture of left shoulder girdle, part unspecified, initial encounter for closed fracture: Secondary | ICD-10-CM | POA: Diagnosis not present

## 2017-02-01 MED ORDER — HYDROCODONE-ACETAMINOPHEN 5-325 MG PO TABS: 1.0000 | ORAL_TABLET | Freq: Two times a day (BID) | ORAL | 0 refills | Status: DC | PRN
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
## 2017-02-01 NOTE — Addendum Note (Signed)
Addended byBrand Males on: 02/01/2017 03:33 PM   Modules accepted: Orders

## 2017-02-02 ENCOUNTER — Telehealth (INDEPENDENT_AMBULATORY_CARE_PROVIDER_SITE_OTHER): Payer: Self-pay | Admitting: Orthopaedic Surgery

## 2017-02-02 NOTE — Telephone Encounter (Signed)
PA done through [employer]. Pending decision. Will check back tomorrow. Confirmation #: [id number]

## 2017-02-02 NOTE — Telephone Encounter (Signed)
Patient called saying that medicaid will not cover the hydrocodone. Stated that some papers were faxed over for Dr. Erlinda Hong to sign so it could be covered. CB # [phone]

## 2017-02-03 NOTE — Telephone Encounter (Signed)
Rx was denied by [facility]

## 2017-02-03 NOTE — Telephone Encounter (Signed)
Tried to call patient but no answer and could not leave a voicemail, mail box has not been set up yet. If she calls back please advise. Thanks.

## 2017-02-04 ENCOUNTER — Telehealth (INDEPENDENT_AMBULATORY_CARE_PROVIDER_SITE_OTHER): Payer: Self-pay | Admitting: *Deleted

## 2017-02-04 NOTE — Telephone Encounter (Signed)
Patient came in this afternoon in regards to needing a work note. The one she currently has is not sufficient enough for her job. The letter needs to state that she can return to work but she cannot use her left arm at all. Thank you her CB # [phone].

## 2017-02-08 NOTE — Telephone Encounter (Signed)
That's fine

## 2017-02-08 NOTE — Telephone Encounter (Signed)
Please advise 

## 2017-02-08 NOTE — Telephone Encounter (Signed)
Note ready for pick up at the front desk. Pt aware.

## 2017-02-10 ENCOUNTER — Encounter: Payer: Self-pay | Admitting: Physical Therapy

## 2017-02-10 ENCOUNTER — Ambulatory Visit: Payer: Medicaid Other | Attending: Pediatrics | Admitting: Physical Therapy

## 2017-02-10 DIAGNOSIS — M6281 Muscle weakness (generalized): Secondary | ICD-10-CM

## 2017-02-10 DIAGNOSIS — M25512 Pain in left shoulder: Secondary | ICD-10-CM | POA: Diagnosis present
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
## 2017-02-10 NOTE — Patient Instructions (Signed)
"   Bench press" x 10 x 5-10 sec hold with cane   Cane Overhead - Supine  Hold cane at thighs with both hands, extend arms straight over head. Hold _10__ seconds. Repeat __10_ times. Do __2_ times per day.  External Rotation (Eccentric), Active-Assist - Supine (Cane)  Lie on back, affected arm out from side, elbow at 90, forearm forward. Use cane to assist in lifting forearm of affected arm to neutral. Slowly lower for 3-5 seconds. _10__ reps per set, __1_ sets per day, __5-7_ days per week.   Copyright  VHI. All rights reserved.       Strengthening: Isometric Flexion  Using wall for resistance, press right fist into ball using light pressure. Hold __5__ seconds. Repeat __10__ times per set. Do __1__ sets per session. Do __2__ sessions per day.  SHOULDER: Abduction (Isometric)  Use wall as resistance. Press arm against pillow. Keep elbow straight. Hold __5_ seconds. __10_ reps per set, ___2 sets per day, __5-7_ days per week  Extension (Isometric)  Place left bent elbow and back of arm against wall. Press elbow against wall. Hold __5__ seconds. Repeat __10__ times. Do __2__ sessions per day.  Internal Rotation (Isometric)  Place palm of right fist against door frame, with elbow bent. Press fist against door frame. Hold __5__ seconds. Repeat ___10_ times. Do _2___ sessions per day.  External Rotation (Isometric)  Place back of left fist against door frame, with elbow bent. Press fist against door frame. Hold _5___ seconds. Repeat __10__ times. Do ____2 sessions per day.  Copyright  VHI. All rights reserved.     AVOID reaching back especially in the goal post positioning AVOID Pec stretching

## 2017-02-24 ENCOUNTER — Encounter: Payer: Medicaid Other | Admitting: Physical Therapy

## 2017-02-28 ENCOUNTER — Encounter: Payer: Medicaid Other | Admitting: Physical Therapy

## 2017-03-01 ENCOUNTER — Encounter (HOSPITAL_COMMUNITY): Payer: Self-pay

## 2017-03-01 ENCOUNTER — Emergency Department (HOSPITAL_COMMUNITY)
Admission: EM | Admit: 2017-03-01 | Discharge: 2017-03-02 | Disposition: A | Discharge status: Home / Self Care | Payer: Medicaid Other | Attending: Emergency Medicine | Admitting: Emergency Medicine

## 2017-03-01 DIAGNOSIS — Z7951 Long term (current) use of inhaled steroids: Secondary | ICD-10-CM | POA: Insufficient documentation

## 2017-03-01 DIAGNOSIS — J45909 Unspecified asthma, uncomplicated: Secondary | ICD-10-CM | POA: Insufficient documentation

## 2017-03-01 DIAGNOSIS — W92XXXA Exposure to excessive heat of man-made origin, initial encounter: Secondary | ICD-10-CM | POA: Diagnosis not present

## 2017-03-01 DIAGNOSIS — R112 Nausea with vomiting, unspecified: Secondary | ICD-10-CM | POA: Insufficient documentation

## 2017-03-01 DIAGNOSIS — T675XXA Heat exhaustion, unspecified, initial encounter: Secondary | ICD-10-CM | POA: Diagnosis not present

## 2017-03-01 DIAGNOSIS — R42 Dizziness and giddiness: Secondary | ICD-10-CM | POA: Diagnosis present

## 2017-03-01 DIAGNOSIS — R51 Headache: Secondary | ICD-10-CM | POA: Diagnosis not present

## 2017-03-01 DIAGNOSIS — Z87891 Personal history of nicotine dependence: Secondary | ICD-10-CM | POA: Diagnosis not present

## 2017-03-01 DIAGNOSIS — R519 Headache, unspecified: Secondary | ICD-10-CM

## 2017-03-01 DIAGNOSIS — E860 Dehydration: Secondary | ICD-10-CM | POA: Insufficient documentation

## 2017-03-01 MED ORDER — METOCLOPRAMIDE HCL 5 MG/ML IJ SOLN
10.0000 mg | Freq: Once | INTRAMUSCULAR | Status: AC
  Administered 2017-03-02: 10 mg via INTRAVENOUS
  Filled 2017-03-01: qty 2

## 2017-03-01 MED ORDER — DIPHENHYDRAMINE HCL 50 MG/ML IJ SOLN
25.0000 mg | Freq: Once | INTRAMUSCULAR | Status: AC
  Administered 2017-03-02: 25 mg via INTRAVENOUS
  Filled 2017-03-01: qty 1

## 2017-03-01 MED ORDER — SODIUM CHLORIDE 0.9 % IV BOLUS (SEPSIS)
1000.0000 mL | Freq: Once | INTRAVENOUS | Status: AC
  Administered 2017-03-02: 1000 mL via INTRAVENOUS

## 2017-03-01 NOTE — ED Provider Notes (Signed)
[facility] DEPT Provider Note   [name]: [id number] Arrival date & time: 03/01/17  2021  By signing my name below, I, Theresia Bough, attest that this documentation has been prepared under the direction and in the presence of Delora Fuel, MD. Electronically Signed: Theresia Bough, ED Scribe. 03/01/17. 11:11 PM.  History   Chief Complaint Chief Complaint  Patient presents with  . Heat Exposure   The history is provided by the patient. No language interpreter was used.   HPI Comments: Jessica Fitzpatrick is a 21 y.o. female who presents to the Emergency Department complaining of constant, moderate heat exposure that occured prior to arrival all day today. 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 No LOC. Pt reports associated dizziness, vomiting (3 times) and nausea. Pt states she was drinking alcohol today during work. No treatments tried prior to arrival in the ED. No other complaints at this time.  PCP: Dr. Tinnie Gens.   Past Medical History:  Diagnosis Date  . Asthma Jan 2013   no occurences since then  . Headache(784.0)    associated with menses  . Hidradenitis axillaris 04/2013    Patient Active Problem List   Diagnosis Date Noted  . Traumatic closed displaced fracture of left shoulder with anterior dislocation 02/01/2017  . Failed vision screen 05/23/2013  . Failed hearing screening 05/23/2013  . Body mass index, pediatric, 85th percentile to less than 95th percentile for age 90/06/2013    Past Surgical History:  Procedure Laterality Date  . INCISION AND DRAINAGE ABSCESS Bilateral 05/10/2013   Procedure: INCISION AND DRAINAGE of bilateral axillary hydradenitis;  Surgeon: Leighton Ruff, MD;  Location: [facility];  Service: General;  Laterality: Bilateral;    OB History    Gravida Para Term Preterm AB Living   0 0 0 0 0 0   SAB TAB Ectopic Multiple Live Births   0 0 0 0         Home Medications    Prior to Admission medications     Medication Sig Start Date End Date Taking? Authorizing Provider  albuterol (PROVENTIL HFA;VENTOLIN HFA) 108 (90 BASE) MCG/ACT inhaler Inhale 2 puffs into the lungs every 6 (six) hours as needed for wheezing or shortness of breath.   Yes [provider]  HYDROcodone-acetaminophen (NORCO) 5-325 MG tablet Take 1 tablet by mouth 2 (two) times daily as needed. 02/01/17  Yes Leandrew Koyanagi, MD  tiZANidine (ZANAFLEX) 2 MG tablet Take 2 mg by mouth 2 (two) times daily as needed for muscle spasms.  01/05/16  Yes [provider]  amoxicillin-clavulanate (AUGMENTIN) 875-125 MG tablet Take 1 tablet by mouth every 12 (twelve) hours. Patient not taking: Reported on 03/01/2017 01/27/17   Mesner, Corene Cornea, MD  cyclobenzaprine (FLEXERIL) 10 MG tablet Take 1 tablet (10 mg total) by mouth 2 (two) times daily as needed for muscle spasms. Patient not taking: Reported on 03/01/2017 01/27/17   Mesner, Corene Cornea, MD  naproxen (NAPROSYN) 500 MG tablet Take 1 tablet (500 mg total) by mouth 2 (two) times daily. Patient not taking: Reported on 02/10/2017 09/03/16   Kinnie Feil, PA-C  oxyCODONE-acetaminophen (PERCOCET) 5-325 MG tablet Take 1 tablet by mouth every 8 (eight) hours as needed for severe pain. Patient not taking: Reported on 02/10/2017 01/27/17   Mesner, Corene Cornea, MD    Family History Family History  Problem Relation Age of Onset  . Cancer Maternal Aunt        Breast. diagnosed young  .  Cancer Maternal Grandmother        Breast  . Lupus Brother     Social History Social History  Substance Use Topics  . Smoking status: Former Smoker    Quit date: 05/02/2013  . Smokeless tobacco: Never Used     Comment: mom smokes  . Alcohol use 1.2 oz/week    2 Cans of beer per week     Allergies   Ibuprofen   Review of Systems Review of Systems  Gastrointestinal: Positive for nausea and vomiting.  Neurological: Positive for dizziness. Negative for syncope.  All other systems reviewed and are  negative.    Physical Exam Updated Vital Signs BP 110/76   Resp 16   SpO2 99%   Physical Exam  Constitutional: She is oriented to person, place, and time. She appears well-developed and well-nourished.  HENT:  Head: Normocephalic and atraumatic.  Eyes: EOM are normal. Pupils are equal, round, and reactive to light.  Neck: Normal range of motion. Neck supple. No JVD present.  Cardiovascular: Normal rate, regular rhythm and normal heart sounds.   No murmur heard. Pulmonary/Chest: Effort normal and breath sounds normal. She has no wheezes. She has no rales. She exhibits no tenderness.  Abdominal: Soft. Bowel sounds are normal. She exhibits no distension and no mass. There is no tenderness.  Musculoskeletal: Normal range of motion. She exhibits no edema.  Lymphadenopathy:    She has no cervical adenopathy.  Neurological: She is alert and oriented to person, place, and time. No cranial nerve deficit. She exhibits normal muscle tone. Coordination normal.  Skin: Skin is warm and dry. No rash noted.  Psychiatric: She has a normal mood and affect. Her behavior is normal. Judgment and thought content normal.  Nursing note and vitals reviewed.    ED Treatments / Results  DIAGNOSTIC STUDIES: Oxygen Saturation is 99% on RA, normal by my interpretation.   COORDINATION OF CARE: 11:06 PM-Discussed next steps with pt including IV fluids and medication for nausea. Pt verbalized understanding and is agreeable with the plan.   Labs (all labs ordered are listed, but only abnormal results are displayed) Labs Reviewed  CBC WITH DIFFERENTIAL/PLATELET - Abnormal; Notable for the following:       Result Value   WBC 10.9 (*)    All other components within normal limits  BASIC METABOLIC PANEL - Abnormal; Notable for the following:    CO2 19 (*)    All other components within normal limits  HCG, QUANTITATIVE, PREGNANCY     Procedures Procedures (including critical care time)  Medications  Ordered in ED Medications  sodium chloride 0.9 % bolus 1,000 mL (1,000 mLs Intravenous New Bag/Given 03/02/17 0015)  metoCLOPramide (REGLAN) injection 10 mg (10 mg Intravenous Given 03/02/17 0014)  diphenhydrAMINE (BENADRYL) injection 25 mg (25 mg Intravenous Given 03/02/17 0015)     Initial Impression / Assessment and Plan / ED Course  I have reviewed the triage vital signs and the nursing notes.  Pertinent labs & imaging results that were available during my care of the patient were reviewed by me and considered in my medical decision making (see chart for details).  Heat exhaustion and dehydration. She received some IV fluids prior to arriving in the ED. Headache is likely related to dehydration. She is given additional fluids, metoclopramide with excellent relief of symptoms. She is discharged with injections to make sure she maintains adequate fluid intake when in similar settings in the future. Old records reviewed, and she has no relevant  past visits.  Final Clinical Impressions(s) / ED Diagnoses   Final diagnoses:  Heat exhaustion, initial encounter  Dehydration  Non-intractable vomiting with nausea, unspecified vomiting type  Headache, unspecified headache type    New Prescriptions New Prescriptions   No medications on file   I personally performed the services described in this documentation, which was scribed in my presence. The recorded information has been reviewed and is accurate.       Delora Fuel, MD 77/93/90 [phone]

## 2017-03-01 NOTE — ED Triage Notes (Signed)
[place] and nauseas. Pt state she vomited x 3. Pt state she did not pass out or fall at the time.

## 2017-03-01 NOTE — ED Notes (Signed)
Per EMS- Pt was working outside, felt dizzy, sat down and lost consciousness briefly. 90.60 initial pressure, orthostatic changes. Given 715ml of NS, pressure up to 123 88.

## 2017-03-02 ENCOUNTER — Encounter: Payer: Medicaid Other | Admitting: Physical Therapy

## 2017-03-02 LAB — CBC WITH DIFFERENTIAL/PLATELET
BASOS PCT: 0 %
Basophils Absolute: 0 10*3/uL (ref 0.0–0.1)
EOS ABS: 0.2 10*3/uL (ref 0.0–0.7)
Eosinophils Relative: 2 %
HCT: 36 % (ref 36.0–46.0)
Hemoglobin: 12.7 g/dL (ref 12.0–15.0)
Lymphocytes Relative: 21 %
Lymphs Abs: 2.3 10*3/uL (ref 0.7–4.0)
MCH: 31.2 pg (ref 26.0–34.0)
MCHC: 35.3 g/dL (ref 30.0–36.0)
MCV: 88.5 fL (ref 78.0–100.0)
MONO ABS: 0.9 10*3/uL (ref 0.1–1.0)
MONOS PCT: 8 %
Neutro Abs: 7.4 10*3/uL (ref 1.7–7.7)
Neutrophils Relative %: 69 %
Platelets: 221 10*3/uL (ref 150–400)
RBC: 4.07 MIL/uL (ref 3.87–5.11)
RDW: 13.4 % (ref 11.5–15.5)
WBC: 10.9 10*3/uL — ABNORMAL HIGH (ref 4.0–10.5)

## 2017-03-02 LAB — BASIC METABOLIC PANEL
Anion gap: 9 (ref 5–15)
BUN: 10 mg/dL (ref 6–20)
CALCIUM: 8.9 mg/dL (ref 8.9–10.3)
CO2: 19 mmol/L — AB (ref 22–32)
CREATININE: 0.81 mg/dL (ref 0.44–1.00)
Chloride: 111 mmol/L (ref 101–111)
GFR calc non Af Amer: >60 mL/min (ref >60)
GLUCOSE: 88 mg/dL (ref 65–99)
Potassium: 3.6 mmol/L (ref 3.5–5.1)
Sodium: 139 mmol/L (ref 135–145)

## 2017-03-02 LAB — HCG, QUANTITATIVE, PREGNANCY: hCG, Beta Chain, Quant, S: <1 m[IU]/mL (ref <5)

## 2017-03-02 NOTE — ED Notes (Signed)
Patient is alert and oriented x3.  She was given DC instructions and follow up visit instructions.  Patient gave verbal understanding. She was DC ambulatory under her own power to home.  V/S stable.  He was not showing any signs of distress on DC 

## 2017-03-02 NOTE — Discharge Instructions (Signed)
MAke sure to drink plenty of fluids when you are exposed to heat.

## 2017-03-07 ENCOUNTER — Encounter: Payer: Medicaid Other | Admitting: Physical Therapy

## 2017-03-08 ENCOUNTER — Ambulatory Visit (INDEPENDENT_AMBULATORY_CARE_PROVIDER_SITE_OTHER): Payer: Medicaid Other | Admitting: Orthopaedic Surgery

## 2017-03-09 ENCOUNTER — Encounter: Payer: Medicaid Other | Admitting: Physical Therapy

## 2017-03-15 ENCOUNTER — Ambulatory Visit (INDEPENDENT_AMBULATORY_CARE_PROVIDER_SITE_OTHER): Payer: Medicaid Other | Admitting: Orthopaedic Surgery

## 2017-05-31 ENCOUNTER — Emergency Department (HOSPITAL_COMMUNITY)
Admission: EM | Admit: 2017-05-31 | Discharge: 2017-05-31 | Discharge status: Against Medical Advice | Payer: Medicaid Other

## 2017-05-31 NOTE — ED Triage Notes (Signed)
Rn in to triage patient, when informed of wait time, pt decided she would wait and go to urgent care in the morning. LWBS.

## 2017-06-01 ENCOUNTER — Ambulatory Visit (HOSPITAL_COMMUNITY)
Admission: EM | Admit: 2017-06-01 | Discharge: 2017-06-01 | Disposition: A | Discharge status: Home / Self Care | Payer: Medicaid Other | Attending: Family Medicine | Admitting: Family Medicine

## 2017-06-01 ENCOUNTER — Encounter (HOSPITAL_COMMUNITY): Payer: Self-pay | Admitting: Family Medicine

## 2017-06-01 DIAGNOSIS — L03115 Cellulitis of right lower limb: Secondary | ICD-10-CM

## 2017-06-01 MED ORDER — SULFAMETHOXAZOLE-TRIMETHOPRIM 800-160 MG PO TABS: 1.0000 | ORAL_TABLET | Freq: Two times a day (BID) | ORAL | 0 refills | Status: DC

## 2017-06-01 NOTE — ED Provider Notes (Signed)
  [place]   [id number] 06/01/17 Arrival Time: 1006  ASSESSMENT & PLAN:  1. Cellulitis of right lower extremity     Meds ordered this encounter  Medications  . sulfamethoxazole-trimethoprim (BACTRIM DS,SEPTRA DS) 800-160 MG tablet    Sig: Take 1 tablet by mouth 2 (two) times daily.    Dispense:  20 tablet    Refill:  0   Recommend f/u in 48 hours for recheck, sooner if needed. Tylenol as needed. Reviewed expectations re: course of current medical issues. Questions answered. Outlined signs and symptoms indicating need for more acute intervention. Patient verbalized understanding. After Visit Summary given.   SUBJECTIVE:  Jessica Fitzpatrick is a 21 y.o. female who presents with complaint of possible insect bite to RLE just above ankle. Noticed approx 2 days ago. More tender today. Slight increase in swelling and erythema. Scant drainage yesterday. No bleeding. No OTC treatment. Ambulatory. Afebrile.  ROS: As per HPI.   OBJECTIVE:  Vitals:   06/01/17 1050  BP: (!) 129/98  Pulse: 84  Resp: 18  Temp: 98.5 F (36.9 C)  SpO2: 98%    General appearance: alert; no distress Skin: warm and dry; just above R lateral ankle  he has an approx 2cm area of erythema; warm to touch; tender; no significant fluctuance Neurologic: normal gait Psychological: alert and cooperative; normal mood and affect  Allergies  Allergen Reactions  . Ibuprofen Hives and Itching    Past Medical History:  Diagnosis Date  . Asthma Jan 2013   no occurences since then  . Headache(784.0)    associated with menses  . Hidradenitis axillaris 04/2013   Social History   Social History  . Marital status: Single    Spouse name: N/A  . Number of children: N/A  . Years of education: N/A   Occupational History  . Not on file.   Social History Main Topics  . Smoking status: Former Smoker    Quit date: 05/02/2013  . Smokeless tobacco: Never Used     Comment: mom smokes  . Alcohol use  1.2 oz/week    2 Cans of beer per week  . Drug use: No  . Sexual activity: Yes    Birth control/ protection: None   Other Topics Concern  . Not on file   Social History Narrative   Lives with mom currently but was in aunt's custody from age 73 until recently.   Family History  Problem Relation Age of Onset  . Cancer Maternal Aunt        Breast. diagnosed young  . Cancer Maternal Grandmother        Breast  . Lupus Brother    Past Surgical History:  Procedure Laterality Date  . INCISION AND DRAINAGE ABSCESS Bilateral 05/10/2013   Procedure: INCISION AND DRAINAGE of bilateral axillary hydradenitis;  Surgeon: Leighton Ruff, MD;  Location: [facility];  Service: General;  Laterality: Jilda Panda, MD 06/01/17 1116

## 2017-06-01 NOTE — ED Triage Notes (Signed)
Pt here for abscess to RLE x 3 days. Erythema, swelling and clear drainage.

## 2017-08-03 ENCOUNTER — Encounter (HOSPITAL_COMMUNITY): Payer: Self-pay | Admitting: Emergency Medicine

## 2017-08-03 ENCOUNTER — Ambulatory Visit (HOSPITAL_COMMUNITY)
Admission: EM | Admit: 2017-08-03 | Discharge: 2017-08-03 | Disposition: A | Discharge status: Home / Self Care | Payer: Medicaid Other | Attending: Family Medicine | Admitting: Family Medicine

## 2017-08-03 ENCOUNTER — Other Ambulatory Visit: Payer: Self-pay

## 2017-08-03 DIAGNOSIS — N946 Dysmenorrhea, unspecified: Secondary | ICD-10-CM

## 2017-08-03 DIAGNOSIS — R1030 Lower abdominal pain, unspecified: Secondary | ICD-10-CM

## 2017-08-03 NOTE — ED Provider Notes (Signed)
[place]    [name]: [id number] Arrival date & time: 08/03/17  1613     History   Chief Complaint Chief Complaint  Patient presents with  . Abdominal Pain    HPI Jessica Fitzpatrick is a 21 y.o. female.   [name] presents with complaints of central abdominal pain which seems to radiate to her rectum. It started today. She started her menstrual cycle today. It has not worsened. It is sharp in nature. Without fevers, nausea vomiting or diarrhea. Last bm was yesterday and was normal for her. Denies urinary or vaginal symptoms. She is sexually active with females and states she "doesn't receive any penetration." has not taken any medications for her symptoms. States she had to miss work due to pain.    ROS per HPI.       Past Medical History:  Diagnosis Date  . Asthma Jan 2013   no occurences since then  . Headache(784.0)    associated with menses  . Hidradenitis axillaris 04/2013    Patient Active Problem List   Diagnosis Date Noted  . Traumatic closed displaced fracture of left shoulder with anterior dislocation 02/01/2017  . Failed vision screen 05/23/2013  . Failed hearing screening 05/23/2013  . Body mass index, pediatric, 85th percentile to less than 95th percentile for age 63/06/2013    Past Surgical History:  Procedure Laterality Date  . INCISION AND DRAINAGE ABSCESS Bilateral 05/10/2013   Procedure: INCISION AND DRAINAGE of bilateral axillary hydradenitis;  Surgeon: Leighton Ruff, MD;  Location: [facility];  Service: General;  Laterality: Bilateral;    OB History    Gravida Para Term Preterm AB Living   0 0 0 0 0 0   SAB TAB Ectopic Multiple Live Births   0 0 0 0         Home Medications    Prior to Admission medications   Medication Sig Start Date End Date Taking? Authorizing Provider  albuterol (PROVENTIL HFA;VENTOLIN HFA) 108 (90 BASE) MCG/ACT inhaler Inhale 2 puffs into the lungs every 6 (six) hours as needed for wheezing or shortness of  breath.    [provider]    Family History Family History  Problem Relation Age of Onset  . Cancer Maternal Aunt        Breast. diagnosed young  . Cancer Maternal Grandmother        Breast  . Lupus Brother     Social History Social History   Tobacco Use  . Smoking status: Former Smoker    Last attempt to quit: 05/02/2013    Years since quitting: 4.2  . Smokeless tobacco: Never Used  . Tobacco comment: mom smokes  Substance Use Topics  . Alcohol use: Yes    Alcohol/week: 1.2 oz    Types: 2 Cans of beer per week  . Drug use: No     Allergies   Ibuprofen   Review of Systems Review of Systems   Physical Exam Triage Vital Signs ED Triage Vitals  Enc Vitals Group     BP 08/03/17 1637 119/70     Pulse Rate 08/03/17 1637 76     Resp 08/03/17 1637 16     Temp 08/03/17 1637 98.6 F (37 C)     Temp src --      SpO2 08/03/17 1637 100 %     Weight --      Height --      Head Circumference --      Peak Flow --  Pain Score 08/03/17 1638 6     Pain Loc --      Pain Edu? --      Excl. in [place]? --    No data found.  Updated Vital Signs BP 119/70   Pulse 76   Temp 98.6 F (37 C)   Resp 16   LMP 08/03/2017   SpO2 100%   Visual Acuity Right Eye Distance:   Left Eye Distance:   Bilateral Distance:    Right Eye Near:   Left Eye Near:    Bilateral Near:     Physical Exam  Constitutional: She is oriented to person, place, and time. She appears well-developed and well-nourished. No distress.  Cardiovascular: Normal rate, regular rhythm and normal heart sounds.  Pulmonary/Chest: Effort normal and breath sounds normal.  Abdominal: Soft. There is tenderness in the suprapubic area. There is no rigidity, no guarding and no CVA tenderness.  Genitourinary: Rectum normal.  Neurological: She is alert and oriented to person, place, and time.  Skin: Skin is warm and dry.  Vitals reviewed.    UC Treatments / Results  Labs (all labs ordered are listed,  but only abnormal results are displayed) Labs Reviewed - No data to display  EKG  EKG Interpretation None       Radiology No results found.  Procedures Procedures (including critical care time)  Medications Ordered in UC Medications - No data to display   Initial Impression / Assessment and Plan / UC Course  I have reviewed the triage vital signs and the nursing notes.  Pertinent labs & imaging results that were available during my care of the patient were reviewed by me and considered in my medical decision making (see chart for details).     Patient declined urine testing today for uti/ cytology. Agreeable that likely related to menstruation. nsaids recommended. Without tachycardia, fever or tachypnea. Non toxic non distressed in appearance. Return precautions provided.If symptoms worsen or do not improve in the next week to return to be seen or to follow up with PCP. Patient verbalized understanding and agreeable to plan.    Final Clinical Impressions(s) / UC Diagnoses   Final diagnoses:  Lower abdominal pain  Menstrual cramps    ED Discharge Orders    None       Controlled Substance Prescriptions [place] Controlled Substance Registry consulted? Not Applicable   Zigmund Gottron, NP 08/03/17 1729

## 2017-08-03 NOTE — ED Triage Notes (Signed)
Pt c/o lower abdominal pain generalized with pain shooting to rectum. Pt states it hurts sometimes when sitting down.

## 2017-08-03 NOTE — Discharge Instructions (Signed)
Continue to monitor symptoms. Use of ibuprofen or naproxen for pain, take with food. Warm pack application. If develop increased pain, fevers or no improvement of symptoms in the next 2-5 days please return to be seen.

## 2018-03-22 ENCOUNTER — Other Ambulatory Visit: Payer: Self-pay

## 2018-03-22 ENCOUNTER — Encounter (HOSPITAL_COMMUNITY): Payer: Self-pay

## 2018-03-22 ENCOUNTER — Emergency Department (HOSPITAL_COMMUNITY)
Admission: EM | Admit: 2018-03-22 | Discharge: 2018-03-22 | Disposition: A | Discharge status: Home / Self Care | Payer: Self-pay | Attending: Emergency Medicine | Admitting: Emergency Medicine

## 2018-03-22 ENCOUNTER — Emergency Department (HOSPITAL_COMMUNITY): Payer: Self-pay

## 2018-03-22 DIAGNOSIS — J45909 Unspecified asthma, uncomplicated: Secondary | ICD-10-CM | POA: Insufficient documentation

## 2018-03-22 DIAGNOSIS — D219 Benign neoplasm of connective and other soft tissue, unspecified: Secondary | ICD-10-CM | POA: Insufficient documentation

## 2018-03-22 DIAGNOSIS — Z87891 Personal history of nicotine dependence: Secondary | ICD-10-CM | POA: Insufficient documentation

## 2018-03-22 DIAGNOSIS — N83201 Unspecified ovarian cyst, right side: Secondary | ICD-10-CM | POA: Insufficient documentation

## 2018-03-22 DIAGNOSIS — R102 Pelvic and perineal pain: Secondary | ICD-10-CM

## 2018-03-22 LAB — URINALYSIS, ROUTINE W REFLEX MICROSCOPIC
Bilirubin Urine: NEGATIVE
Glucose, UA: NEGATIVE mg/dL
Hgb urine dipstick: NEGATIVE
Ketones, ur: NEGATIVE mg/dL
LEUKOCYTES UA: NEGATIVE
NITRITE: NEGATIVE
PH: 5 (ref 5.0–8.0)
Protein, ur: NEGATIVE mg/dL
SPECIFIC GRAVITY, URINE: 1.014 (ref 1.005–1.030)

## 2018-03-22 LAB — CBC
HEMATOCRIT: 34.6 % — AB (ref 36.0–46.0)
HEMOGLOBIN: 11.6 g/dL — AB (ref 12.0–15.0)
MCH: 28 pg (ref 26.0–34.0)
MCHC: 33.5 g/dL (ref 30.0–36.0)
MCV: 83.4 fL (ref 78.0–100.0)
Platelets: 290 10*3/uL (ref 150–400)
RBC: 4.15 MIL/uL (ref 3.87–5.11)
RDW: 17.2 % — ABNORMAL HIGH (ref 11.5–15.5)
WBC: 4.5 10*3/uL (ref 4.0–10.5)

## 2018-03-22 LAB — COMPREHENSIVE METABOLIC PANEL
ALT: 11 U/L (ref 0–44)
ANION GAP: 9 (ref 5–15)
AST: 20 U/L (ref 15–41)
Albumin: 4.1 g/dL (ref 3.5–5.0)
Alkaline Phosphatase: 42 U/L (ref 38–126)
BILIRUBIN TOTAL: 0.6 mg/dL (ref 0.3–1.2)
BUN: 11 mg/dL (ref 6–20)
CO2: 20 mmol/L — ABNORMAL LOW (ref 22–32)
Calcium: 9.1 mg/dL (ref 8.9–10.3)
Chloride: 108 mmol/L (ref 98–111)
Creatinine, Ser: 0.86 mg/dL (ref 0.44–1.00)
GFR calc Af Amer: >60 mL/min (ref >60)
Glucose, Bld: 77 mg/dL (ref 70–99)
POTASSIUM: 4 mmol/L (ref 3.5–5.1)
Sodium: 137 mmol/L (ref 135–145)
TOTAL PROTEIN: 7.6 g/dL (ref 6.5–8.1)

## 2018-03-22 LAB — I-STAT BETA HCG BLOOD, ED (MC, WL, AP ONLY): I-stat hCG, quantitative: <5 m[IU]/mL (ref <5)

## 2018-03-22 LAB — LIPASE, BLOOD: LIPASE: 24 U/L (ref 11–51)

## 2018-03-22 MED ORDER — ACETAMINOPHEN ER 650 MG PO TBCR: 650.0000 mg | EXTENDED_RELEASE_TABLET | Freq: Three times a day (TID) | ORAL | 0 refills | Status: DC | PRN

## 2018-03-22 MED ORDER — ACETAMINOPHEN 325 MG PO TABS
650.0000 mg | ORAL_TABLET | Freq: Once | ORAL | Status: AC
  Administered 2018-03-22: 650 mg via ORAL
  Filled 2018-03-22: qty 2

## 2018-03-22 NOTE — Discharge Instructions (Signed)
The results in the ER show that you have fibroids and ovarian cyst.  [facility] for further evaluation of your pelvic pain and fibroids.

## 2018-03-22 NOTE — ED Provider Notes (Addendum)
[place] DEPT Provider Note   [name]: [id number] Arrival date & time: 03/22/18  4827     History   Chief Complaint Chief Complaint  Patient presents with  . Abdominal Pain    HPI Jessica Fitzpatrick is a 22 y.o. female.  HPI  22 year old female with history of asthma comes in with chief complaint of abdominal pain.  Patient is in a monogamous relationship with partner of same sex.  She reports that she started having pain in the right side of her abdomen this morning.  Pain has been constant since this morning and described as intermittent episodes of intense pain.  Patient is pain-free during history, however she does think that the pain is aggravated when she moves or if she pushes on her abdomen.  Patient denies any associated nausea, vomiting, fevers, chills, diarrhea.  No history of similar pain.  Patient also denies any burning with urination, blood in the urine, vaginal discharge or bleeding.  Of note, patient states that she gets heavy periods.  Past Medical History:  Diagnosis Date  . Asthma Jan 2013   no occurences since then  . Headache(784.0)    associated with menses  . Hidradenitis axillaris 04/2013    Patient Active Problem List   Diagnosis Date Noted  . Traumatic closed displaced fracture of left shoulder with anterior dislocation 02/01/2017  . Failed vision screen 05/23/2013  . Failed hearing screening 05/23/2013  . Body mass index, pediatric, 85th percentile to less than 95th percentile for age 21/06/2013    Past Surgical History:  Procedure Laterality Date  . INCISION AND DRAINAGE ABSCESS Bilateral 05/10/2013   Procedure: INCISION AND DRAINAGE of bilateral axillary hydradenitis;  Surgeon: Leighton Ruff, MD;  Location: [facility];  Service: General;  Laterality: Bilateral;     OB History    Gravida  0   Para  0   Term  0   Preterm  0   AB  0   Living  0     SAB  0   TAB  0   Ectopic  0   Multiple  0   Live  Births               Home Medications    Prior to Admission medications   Medication Sig Start Date End Date Taking? Authorizing Provider  acetaminophen (TYLENOL 8 HOUR) 650 MG CR tablet Take 1 tablet (650 mg total) by mouth every 8 (eight) hours as needed. 03/22/18   Varney Biles, MD  albuterol (PROVENTIL HFA;VENTOLIN HFA) 108 (90 BASE) MCG/ACT inhaler Inhale 2 puffs into the lungs every 6 (six) hours as needed for wheezing or shortness of breath.    [provider]    Family History Family History  Problem Relation Age of Onset  . Cancer Maternal Aunt        Breast. diagnosed young  . Cancer Maternal Grandmother        Breast  . Lupus Brother     Social History Social History   Tobacco Use  . Smoking status: Former Smoker    Last attempt to quit: 05/02/2013    Years since quitting: 4.8  . Smokeless tobacco: Never Used  . Tobacco comment: mom smokes  Substance Use Topics  . Alcohol use: Yes    Alcohol/week: 1.2 oz    Types: 2 Cans of beer per week  . Drug use: No     Allergies   Ibuprofen   Review of Systems  Review of Systems  Constitutional: Positive for activity change.  Gastrointestinal: Positive for abdominal pain.  Allergic/Immunologic: Negative for immunocompromised state.  Hematological: Does not bruise/bleed easily.  All other systems reviewed and are negative.    Physical Exam Updated Vital Signs BP 118/80   Pulse 66   Temp 97.9 F (36.6 C) (Oral)   Resp 16   Ht 5\' 3"  (1.6 m)   LMP 02/28/2018   SpO2 99%   BMI 23.91 kg/m   Physical Exam  Constitutional: She is oriented to person, place, and time. She appears well-developed.  HENT:  Head: Normocephalic and atraumatic.  Eyes: EOM are normal.  Neck: Normal range of motion. Neck supple.  Cardiovascular: Normal rate.  Pulmonary/Chest: Effort normal.  Abdominal: Bowel sounds are normal. There is tenderness in the right lower quadrant and suprapubic area. There is no rebound,  no guarding, no tenderness at McBurney's point and negative Murphy's sign. No hernia.  Neurological: She is alert and oriented to person, place, and time.  Skin: Skin is warm and dry.  Nursing note and vitals reviewed.    ED Treatments / Results  Labs (all labs ordered are listed, but only abnormal results are displayed) Labs Reviewed  COMPREHENSIVE METABOLIC PANEL - Abnormal; Notable for the following components:      Result Value   CO2 20 (*)    All other components within normal limits  CBC - Abnormal; Notable for the following components:   Hemoglobin 11.6 (*)    HCT 34.6 (*)    RDW 17.2 (*)    All other components within normal limits  LIPASE, BLOOD  URINALYSIS, ROUTINE W REFLEX MICROSCOPIC  I-STAT BETA HCG BLOOD, ED ([facility], [facility], [facility] ONLY)    EKG None  Radiology US Pelvis (transabdominal Only)  Result Date: 03/22/2018 CLINICAL DATA:  22 year old with intermittent midpelvic pain today. LMP 02/28/2018. EXAM: TRANSABDOMINAL ULTRASOUND OF PELVIS DOPPLER ULTRASOUND OF OVARIES TECHNIQUE: Transabdominal ultrasound examination of the pelvis was performed including evaluation of the uterus, ovaries, adnexal regions, and pelvic cul-de-sac. Patient is not sexually active and refused the transvaginal study. Color and duplex Doppler ultrasound was utilized to evaluate blood flow to the ovaries. COMPARISON:  Pelvic CT 07/26/2015. FINDINGS: Uterus Measurements: Measures approximately 10.4 x 4.2 x 7.0 cm. The uterus is retroverted. There are multiple intramural fibroids, including a 3.5 x 3.3 x 3.3 cm fibroid in the left uterine body and fundal fibroids measuring 3.5 x 3.9 x 3.0 cm and 1.8 x 0.9 x 1.6 cm. Endometrium Thickness: 6.5 mm.  No focal abnormality visualized. Right ovary Measurements: 6.2 x 3.9 x 4.7 cm. There is a hypoechoic area with low level echoes measuring 4.4 x 3.2 x 3.2 cm which is probably a small complex cyst. Normal blood flow with color Doppler. Left ovary Measurements: 4.3 x 2.4  x 2.9 cm. The left ovary is not well visualized. It contains a small follicle and demonstrates normal blood flow with color Doppler. Pulsed Doppler evaluation demonstrates normal low-resistance arterial and venous waveforms in both ovaries. Other: No free pelvic fluid. IMPRESSION: 1. Multiple uterine fibroids. 2. Hypoechoic right adnexal lesion, likely a complex cyst of the right ovary. 3. The left ovary is not well visualized. However, no evidence of ovarian torsion. Electronically Signed   By: Richardean Sale M.D.   On: 03/22/2018 12:55   US Pelvic Doppler (torsion R/o Or Mass Arterial Flow)  Result Date: 03/22/2018 CLINICAL DATA:  22 year old with intermittent midpelvic pain today. LMP 02/28/2018. EXAM: TRANSABDOMINAL ULTRASOUND OF  PELVIS DOPPLER ULTRASOUND OF OVARIES TECHNIQUE: Transabdominal ultrasound examination of the pelvis was performed including evaluation of the uterus, ovaries, adnexal regions, and pelvic cul-de-sac. Patient is not sexually active and refused the transvaginal study. Color and duplex Doppler ultrasound was utilized to evaluate blood flow to the ovaries. COMPARISON:  Pelvic CT 07/26/2015. FINDINGS: Uterus Measurements: Measures approximately 10.4 x 4.2 x 7.0 cm. The uterus is retroverted. There are multiple intramural fibroids, including a 3.5 x 3.3 x 3.3 cm fibroid in the left uterine body and fundal fibroids measuring 3.5 x 3.9 x 3.0 cm and 1.8 x 0.9 x 1.6 cm. Endometrium Thickness: 6.5 mm.  No focal abnormality visualized. Right ovary Measurements: 6.2 x 3.9 x 4.7 cm. There is a hypoechoic area with low level echoes measuring 4.4 x 3.2 x 3.2 cm which is probably a small complex cyst. Normal blood flow with color Doppler. Left ovary Measurements: 4.3 x 2.4 x 2.9 cm. The left ovary is not well visualized. It contains a small follicle and demonstrates normal blood flow with color Doppler. Pulsed Doppler evaluation demonstrates normal low-resistance arterial and venous waveforms in  both ovaries. Other: No free pelvic fluid. IMPRESSION: 1. Multiple uterine fibroids. 2. Hypoechoic right adnexal lesion, likely a complex cyst of the right ovary. 3. The left ovary is not well visualized. However, no evidence of ovarian torsion. Electronically Signed   By: Richardean Sale M.D.   On: 03/22/2018 12:55    Procedures Procedures (including critical care time)  Medications Ordered in ED Medications  acetaminophen (TYLENOL) tablet 650 mg (650 mg Oral Given 03/22/18 1347)     Initial Impression / Assessment and Plan / ED Course  I have reviewed the triage vital signs and the nursing notes.  Pertinent labs & imaging results that were available during my care of the patient were reviewed by me and considered in my medical decision making (see chart for details).    22 year old female comes in with chief complaint of lower quadrant abdominal pain.  Patient is mostly having suprapubic pain on my exam. No clinical concerns for appendicitis or diverticulitis. No clinical concerns for TOA or PID.  We will get ultrasound.  I reviewed patient's CT scan from last year and it seems like she has some uterine anatomic abnormalities.  2:25 PM Results from the ER workup discussed with the patient face to face and all questions answered to the best of my ability. Strict ER return precautions have been discussed, and patient is agreeing with the plan and is comfortable with the workup done and the recommendations from the ER.   Final Clinical Impressions(s) / ED Diagnoses   Final diagnoses:  Pelvic pain  Fibroids  Cyst of right ovary    ED Discharge Orders        Ordered    acetaminophen (TYLENOL 8 HOUR) 650 MG CR tablet  Every 8 hours PRN     03/22/18 1425       Varney Biles, MD 03/22/18 [place], Ashaad Gaertner, MD 03/22/18 1425

## 2018-03-22 NOTE — ED Triage Notes (Signed)
Patient c/o mid and upper abdominal pain since 0700 today. Patient denies any N/V/D

## 2018-05-01 ENCOUNTER — Telehealth: Payer: Self-pay

## 2018-05-01 ENCOUNTER — Encounter: Payer: Self-pay | Admitting: Obstetrics and Gynecology

## 2018-05-01 ENCOUNTER — Ambulatory Visit (INDEPENDENT_AMBULATORY_CARE_PROVIDER_SITE_OTHER): Payer: Medicaid Other | Admitting: Obstetrics and Gynecology

## 2018-05-01 ENCOUNTER — Other Ambulatory Visit (HOSPITAL_COMMUNITY)
Admission: RE | Admit: 2018-05-01 | Discharge: 2018-05-01 | Disposition: A | Discharge status: Home / Self Care | Payer: Medicaid Other | Source: Ambulatory Visit | Attending: Obstetrics and Gynecology | Admitting: Obstetrics and Gynecology

## 2018-05-01 VITALS — BP 128/73 | HR 62 | Ht 63.0 in | Wt 140.4 lb

## 2018-05-01 DIAGNOSIS — D219 Benign neoplasm of connective and other soft tissue, unspecified: Secondary | ICD-10-CM

## 2018-05-01 DIAGNOSIS — R102 Pelvic and perineal pain: Secondary | ICD-10-CM

## 2018-05-01 DIAGNOSIS — N83209 Unspecified ovarian cyst, unspecified side: Secondary | ICD-10-CM | POA: Insufficient documentation

## 2018-05-01 DIAGNOSIS — Z309 Encounter for contraceptive management, unspecified: Secondary | ICD-10-CM

## 2018-05-01 DIAGNOSIS — N83201 Unspecified ovarian cyst, right side: Secondary | ICD-10-CM

## 2018-05-01 DIAGNOSIS — Z0000 Encounter for general adult medical examination without abnormal findings: Secondary | ICD-10-CM

## 2018-05-01 NOTE — Progress Notes (Signed)
Jessica Fitzpatrick is a 22 y.o. G0P0000 female here for a routine annual gynecologic exam and ER follow up. Pt reports chronic pelvic pain for several years. Pain is almost daily but intensifies 1 week prior to her cycle. Cycles are monthly but not the same time each month. Last 4-5 days, heavy with cramps. Tried one type of OCP in the past. States did not work and did not like the way it made her feel. She also reports inguinal swelling 1 week prior to her cycles.  She is in a same sex relationship. H/O asthma, o/w denies chronic medical problems or medications.   July U/S revealed uterine fibroids and right ovarian cyst.    Gynecologic History Patient's last menstrual period was 04/25/2018 (exact date). Contraception: none Last Pap: NA. Results were: NA Last mammogram: NA. Results were: NA  Obstetric History OB History  Gravida Para Term Preterm AB Living  0 0 0 0 0 0  SAB TAB Ectopic Multiple Live Births  0 0 0 0 0    Past Medical History:  Diagnosis Date  . Asthma Jan 2013   no occurences since then  . Headache(784.0)    associated with menses  . Hidradenitis axillaris 04/2013    Past Surgical History:  Procedure Laterality Date  . INCISION AND DRAINAGE ABSCESS Bilateral 05/10/2013   Procedure: INCISION AND DRAINAGE of bilateral axillary hydradenitis;  Surgeon: Leighton Ruff, MD;  Location: [facility];  Service: General;  Laterality: Bilateral;    Current Outpatient Medications on File Prior to Visit  Medication Sig Dispense Refill  . acetaminophen (TYLENOL 8 HOUR) 650 MG CR tablet Take 1 tablet (650 mg total) by mouth every 8 (eight) hours as needed. 30 tablet 0  . albuterol (PROVENTIL HFA;VENTOLIN HFA) 108 (90 BASE) MCG/ACT inhaler Inhale 2 puffs into the lungs every 6 (six) hours as needed for wheezing or shortness of breath.     No current facility-administered medications on file prior to visit.     Allergies  Allergen Reactions  . Ibuprofen Hives and Itching     Social History   Socioeconomic History  . Marital status: Single    Spouse name: Not on file  . Number of children: Not on file  . Years of education: Not on file  . Highest education level: Not on file  Occupational History  . Not on file  Social Needs  . Financial resource strain: Not on file  . Food insecurity:    Worry: Not on file    Inability: Not on file  . Transportation needs:    Medical: Not on file    Non-medical: Not on file  Tobacco Use  . Smoking status: Former Smoker    Last attempt to quit: 05/02/2013    Years since quitting: 5.0  . Smokeless tobacco: Never Used  . Tobacco comment: mom smokes  Substance and Sexual Activity  . Alcohol use: Yes    Alcohol/week: 2.0 standard drinks    Types: 2 Cans of beer per week  . Drug use: No  . Sexual activity: Yes    Birth control/protection: None    Comment: With Female  Lifestyle  . Physical activity:    Days per week: Not on file    Minutes per session: Not on file  . Stress: Not on file  Relationships  . Social connections:    Talks on phone: Not on file    Gets together: Not on file    Attends religious service: Not on  file    Active member of club or organization: Not on file    Attends meetings of clubs or organizations: Not on file    Relationship status: Not on file  . Intimate partner violence:    Fear of current or ex partner: Not on file    Emotionally abused: Not on file    Physically abused: Not on file    Forced sexual activity: Not on file  Other Topics Concern  . Not on file  Social History Narrative   Lives with mom currently but was in aunt's custody from age 54 until recently.    Family History  Problem Relation Age of Onset  . Cancer Maternal Aunt        Breast. diagnosed young  . Cancer Maternal Grandmother        Breast  . Lupus Brother     The following portions of the patient's history were reviewed and updated as appropriate: allergies, current medications, past family  history, past medical history, past social history, past surgical history and problem list.  Review of Systems Pertinent items noted in HPI and remainder of comprehensive ROS otherwise negative.   Objective:  BP 128/73   Pulse 62   Ht 5\' 3"  (1.6 m)   Wt 140 lb 6.4 oz (63.7 kg)   LMP 04/25/2018 (Exact Date)   BMI 24.87 kg/m  CONSTITUTIONAL: Well-developed, well-nourished female in no acute distress.  HENT:  Normocephalic, atraumatic, External right and left ear normal. Oropharynx is clear and moist EYES: Conjunctivae and EOM are normal. Pupils are equal, round, and reactive to light. No scleral icterus.  NECK: Normal range of motion, supple, no masses.  Normal thyroid.  SKIN: Skin is warm and dry. No rash noted. Not diaphoretic. No erythema. No pallor. [place]: Alert and oriented to person, place, and time. Normal reflexes, muscle tone coordination. No cranial nerve deficit noted. PSYCHIATRIC: Normal mood and affect. Normal behavior. Normal judgment and thought content. CARDIOVASCULAR: Normal heart rate noted, regular rhythm RESPIRATORY: Clear to auscultation bilaterally. Effort and breath sounds normal, no problems with respiration noted. BREASTS: not examined ABDOMEN: Soft, normal bowel sounds, no distention noted.  No tenderness, rebound or guarding.  PELVIC: Normal appearing external genitalia; normal appearing vaginal mucosa and cervix.  No abnormal discharge noted.  Pap smear obtained.  Pt unable to tolerate bimanual exam. MUSCULOSKELETAL: Normal range of motion. No tenderness.  No cyanosis, clubbing, or edema.  2+ distal pulses.   Assessment:  Annual gynecologic examination with pap smear Uterine Fibroids Right ovarian cyst Chronic pelvic pain. Plan:  Uterine fibroids reviewed with pt and her mother. Mother reports she had fibroids as well prior to her hysterectomy Recommendation for f/u U/S for ovarian cyst reviewed and ordered.  Suspicious for endometriosis as to cause of  pt's pain. Endometriosis reviewed with pt. Tx options discussed. Pt declines hormonal medications at this time. Dx laparoscopy reviewed with pt as well. Limitations of procedure reviewed including no definitely Dx and risks reviewed. Further w/u for chronic pelvic pain including GI, Urology and pain clinic. Mother declines pain clinic as she has had a bad experience with the pain clinic herself. Pt is undecided at this time. Instructed pt to call back with her choice.  She will be contacted with her pap smear results.  F/U per test results and PRN  Chancy Milroy, MD, [place] Attending [place] for [facility], [place]

## 2018-05-01 NOTE — Telephone Encounter (Signed)
Called pt to advise of [place] [facility].Pt not available left message with partner.

## 2018-05-01 NOTE — Patient Instructions (Addendum)
Uterine Fibroids Uterine fibroids are tissue masses (tumors). They are also called leiomyomas. They can develop inside of a woman's womb (uterus). They can grow very large. Fibroids are not cancerous (benign). Most fibroids do not require medical treatment. Follow these instructions at home:  Keep all follow-up visits as told by your doctor. This is important.  Take medicines only as told by your doctor. ? If you were prescribed a hormone treatment, take the hormone medicines exactly as told. ? Do not take aspirin. It can cause bleeding.  Ask your doctor about taking iron pills and increasing the amount of dark green, leafy vegetables in your diet. These actions can help to boost your blood iron levels.  Pay close attention to your period. Tell your doctor about any changes, such as: ? Increased blood flow. This may require you to use more pads or tampons than usual per month. ? A change in the number of days that your period lasts per month. ? A change in symptoms that come with your period, such as back pain or cramping in your belly area (abdomen). Contact a doctor if:  You have pain in your back or the area between your hip bones (pelvic area) that is not controlled by medicines.  You have pain in your abdomen that is not controlled with medicines.  You have an increase in bleeding between and during periods.  You soak tampons or pads in a half hour or less.  You feel lightheaded.  You feel extra tired.  You feel weak. Get help right away if:  You pass out (faint).  You have a sudden increase in pelvic pain. This information is not intended to replace advice given to you by your health care provider. Make sure you discuss any questions you have with your health care provider. Document Released: 10/02/2010 Document Revised: 04/30/2016 Document Reviewed: 02/26/2014 Elsevier Interactive Patient Education  2018 [place] Maintenance, Female Adopting a healthy  lifestyle and getting preventive care can go a long way to promote health and wellness. Talk with your health care provider about what schedule of regular examinations is right for you. This is a good chance for you to check in with your provider about disease prevention and staying healthy. In between checkups, there are plenty of things you can do on your own. Experts have done a lot of research about which lifestyle changes and preventive measures are most likely to keep you healthy. Ask your health care provider for more information. Weight and diet Eat a healthy diet  Be sure to include plenty of vegetables, fruits, low-fat dairy products, and lean protein.  Do not eat a lot of foods high in solid fats, added sugars, or salt.  Get regular exercise. This is one of the most important things you can do for your health. ? Most adults should exercise for at least 150 minutes each week. The exercise should increase your heart rate and make you sweat (moderate-intensity exercise). ? Most adults should also do strengthening exercises at least twice a week. This is in addition to the moderate-intensity exercise.  Maintain a healthy weight  Body mass index (BMI) is a measurement that can be used to identify possible weight problems. It estimates body fat based on height and weight. Your health care provider can help determine your BMI and help you achieve or maintain a healthy weight.  For females 30 years of age and older: ? A BMI below 18.5 is considered underweight. ? A BMI of  18.5 to 24.9 is normal. ? A BMI of 25 to 29.9 is considered overweight. ? A BMI of 30 and above is considered obese.  Watch levels of cholesterol and blood lipids  You should start having your blood tested for lipids and cholesterol at 22 years of age, then have this test every 5 years.  You may need to have your cholesterol levels checked more often if: ? Your lipid or cholesterol levels are high. ? You are older  than 22 years of age. ? You are at high risk for heart disease.  Cancer screening Lung Cancer  Lung cancer screening is recommended for adults 41-15 years old who are at high risk for lung cancer because of a history of smoking.  A yearly low-dose CT scan of the lungs is recommended for people who: ? Currently smoke. ? Have quit within the past 15 years. ? Have at least a 30-pack-year history of smoking. A pack year is smoking an average of one pack of cigarettes a day for 1 year.  Yearly screening should continue until it has been 15 years since you quit.  Yearly screening should stop if you develop a health problem that would prevent you from having lung cancer treatment.  Breast Cancer  Practice breast self-awareness. This means understanding how your breasts normally appear and feel.  It also means doing regular breast self-exams. Let your health care provider know about any changes, no matter how small.  If you are in your 20s or 30s, you should have a clinical breast exam (CBE) by a health care provider every 1-3 years as part of a regular health exam.  If you are 58 or older, have a CBE every year. Also consider having a breast X-ray (mammogram) every year.  If you have a family history of breast cancer, talk to your health care provider about genetic screening.  If you are at high risk for breast cancer, talk to your health care provider about having an MRI and a mammogram every year.  Breast cancer gene (BRCA) assessment is recommended for women who have family members with BRCA-related cancers. BRCA-related cancers include: ? Breast. ? Ovarian. ? Tubal. ? Peritoneal cancers.  Results of the assessment will determine the need for genetic counseling and BRCA1 and BRCA2 testing.  Cervical Cancer Your health care provider may recommend that you be screened regularly for cancer of the pelvic organs (ovaries, uterus, and vagina). This screening involves a pelvic  examination, including checking for microscopic changes to the surface of your cervix (Pap test). You may be encouraged to have this screening done every 3 years, beginning at age 39.  For women ages 40-65, health care providers may recommend pelvic exams and Pap testing every 3 years, or they may recommend the Pap and pelvic exam, combined with testing for human papilloma virus (HPV), every 5 years. Some types of HPV increase your risk of cervical cancer. Testing for HPV may also be done on women of any age with unclear Pap test results.  Other health care providers may not recommend any screening for nonpregnant women who are considered low risk for pelvic cancer and who do not have symptoms. Ask your health care provider if a screening pelvic exam is right for you.  If you have had past treatment for cervical cancer or a condition that could lead to cancer, you need Pap tests and screening for cancer for at least 20 years after your treatment. If Pap tests have been discontinued, your  risk factors (such as having a new sexual partner) need to be reassessed to determine if screening should resume. Some women have medical problems that increase the chance of getting cervical cancer. In these cases, your health care provider may recommend more frequent screening and Pap tests.  Colorectal Cancer  This type of cancer can be detected and often prevented.  Routine colorectal cancer screening usually begins at 22 years of age and continues through 22 years of age.  Your health care provider may recommend screening at an earlier age if you have risk factors for colon cancer.  Your health care provider may also recommend using home test kits to check for hidden blood in the stool.  A small camera at the end of a tube can be used to examine your colon directly (sigmoidoscopy or colonoscopy). This is done to check for the earliest forms of colorectal cancer.  Routine screening usually begins at age  22.  Direct examination of the colon should be repeated every 5-10 years through 22 years of age. However, you may need to be screened more often if early forms of precancerous polyps or small growths are found.  Skin Cancer  Check your skin from head to toe regularly.  Tell your health care provider about any new moles or changes in moles, especially if there is a change in a mole's shape or color.  Also tell your health care provider if you have a mole that is larger than the size of a pencil eraser.  Always use sunscreen. Apply sunscreen liberally and repeatedly throughout the day.  Protect yourself by wearing long sleeves, pants, a wide-brimmed hat, and sunglasses whenever you are outside.  Heart disease, diabetes, and high blood pressure  High blood pressure causes heart disease and increases the risk of stroke. High blood pressure is more likely to develop in: ? People who have blood pressure in the high end of the normal range (130-139/85-89 mm Hg). ? People who are overweight or obese. ? People who are African American.  If you are 77-77 years of age, have your blood pressure checked every 3-5 years. If you are 64 years of age or older, have your blood pressure checked every year. You should have your blood pressure measured twice-once when you are at a hospital or clinic, and once when you are not at a hospital or clinic. Record the average of the two measurements. To check your blood pressure when you are not at a hospital or clinic, you can use: ? An automated blood pressure machine at a pharmacy. ? A home blood pressure monitor.  If you are between 19 years and 72 years old, ask your health care provider if you should take aspirin to prevent strokes.  Have regular diabetes screenings. This involves taking a blood sample to check your fasting blood sugar level. ? If you are at a normal weight and have a low risk for diabetes, have this test once every three years after 22  years of age. ? If you are overweight and have a high risk for diabetes, consider being tested at a younger age or more often. Preventing infection Hepatitis B  If you have a higher risk for hepatitis B, you should be screened for this virus. You are considered at high risk for hepatitis B if: ? You were born in a country where hepatitis B is common. Ask your health care provider which countries are considered high risk. ? Your parents were born in a high-risk  country, and you have not been immunized against hepatitis B (hepatitis B vaccine). ? You have HIV or AIDS. ? You use needles to inject street drugs. ? You live with someone who has hepatitis B. ? You have had sex with someone who has hepatitis B. ? You get hemodialysis treatment. ? You take certain medicines for conditions, including cancer, organ transplantation, and autoimmune conditions.  Hepatitis C  Blood testing is recommended for: ? Everyone born from [birth year] through 1965. ? Anyone with known risk factors for hepatitis C.  Sexually transmitted infections (STIs)  You should be screened for sexually transmitted infections (STIs) including gonorrhea and chlamydia if: ? You are sexually active and are younger than 22 years of age. ? You are older than 22 years of age and your health care provider tells you that you are at risk for this type of infection. ? Your sexual activity has changed since you were last screened and you are at an increased risk for chlamydia or gonorrhea. Ask your health care provider if you are at risk.  If you do not have HIV, but are at risk, it may be recommended that you take a prescription medicine daily to prevent HIV infection. This is called pre-exposure prophylaxis (PrEP). You are considered at risk if: ? You are sexually active and do not regularly use condoms or know the HIV status of your partner(s). ? You take drugs by injection. ? You are sexually active with a partner who has HIV.  Talk  with your health care provider about whether you are at high risk of being infected with HIV. If you choose to begin PrEP, you should first be tested for HIV. You should then be tested every 3 months for as long as you are taking PrEP. Pregnancy  If you are premenopausal and you may become pregnant, ask your health care provider about preconception counseling.  If you may become pregnant, take 400 to 800 micrograms (mcg) of folic acid every day.  If you want to prevent pregnancy, talk to your health care provider about birth control (contraception). Osteoporosis and menopause  Osteoporosis is a disease in which the bones lose minerals and strength with aging. This can result in serious bone fractures. Your risk for osteoporosis can be identified using a bone density scan.  If you are 41 years of age or older, or if you are at risk for osteoporosis and fractures, ask your health care provider if you should be screened.  Ask your health care provider whether you should take a calcium or vitamin D supplement to lower your risk for osteoporosis.  Menopause may have certain physical symptoms and risks.  Hormone replacement therapy may reduce some of these symptoms and risks. Talk to your health care provider about whether hormone replacement therapy is right for you. Follow these instructions at home:  Schedule regular health, dental, and eye exams.  Stay current with your immunizations.  Do not use any tobacco products including cigarettes, chewing tobacco, or electronic cigarettes.  If you are pregnant, do not drink alcohol.  If you are breastfeeding, limit how much and how often you drink alcohol.  Limit alcohol intake to no more than 1 drink per day for nonpregnant women. One drink equals 12 ounces of beer, 5 ounces of wine, or 1 ounces of hard liquor.  Do not use street drugs.  Do not share needles.  Ask your health care provider for help if you need support or information  about quitting drugs.  Tell your health care provider if you often feel depressed.  Tell your health care provider if you have ever been abused or do not feel safe at home. This information is not intended to replace advice given to you by your health care provider. Make sure you discuss any questions you have with your health care provider. Document Released: 03/15/2011 Document Revised: 02/05/2016 Document Reviewed: 06/03/2015 Elsevier Interactive Patient Education  2018 [place].  Pelvic Pain, Female Pelvic pain is pain in your lower belly (abdomen), below your belly button and between your hips. The pain may start suddenly (acute), keep coming back (recurring), or last a long time (chronic). Pelvic pain that lasts longer than six months is considered chronic. There are many causes of pelvic pain. Sometimes the cause of your pelvic pain is not known. Follow these instructions at home:  Take over-the-counter and prescription medicines only as told by your doctor.  Rest as told by your doctor.  Do not have sex it if hurts.  Keep a journal of your pelvic pain. Write down: ? When the pain started. ? Where the pain is located. ? What seems to make the pain better or worse, such as food or your menstrual cycle. ? Any symptoms you have along with the pain.  Keep all follow-up visits as told by your doctor. This is important. Contact a doctor if:  Medicine does not help your pain.  Your pain comes back.  You have new symptoms.  You have unusual vaginal discharge or bleeding.  You have a fever or chills.  You are having a hard time pooping (constipation).  You have blood in your pee (urine) or poop (stool).  Your pee smells bad.  You feel weak or lightheaded. Get help right away if:  You have sudden pain that is very bad.  Your pain continues to get worse.  You have very bad pain and also have any of the following symptoms: ? A fever. ? Feeling stick to your  stomach (nausea). ? Throwing up (vomiting). ? Being very sweaty.  You pass out (lose consciousness). This information is not intended to replace advice given to you by your health care provider. Make sure you discuss any questions you have with your health care provider. Document Released: 02/16/2008 Document Revised: 09/24/2015 Document Reviewed: 06/20/2015 Elsevier Interactive Patient Education  [employer].

## 2018-05-02 LAB — CYTOLOGY - PAP
Chlamydia: NEGATIVE
Diagnosis: NEGATIVE
NEISSERIA GONORRHEA: NEGATIVE
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
## 2018-06-01 ENCOUNTER — Ambulatory Visit (HOSPITAL_COMMUNITY)
Admission: RE | Admit: 2018-06-01 | Discharge: 2018-06-01 | Disposition: A | Discharge status: Home / Self Care | Payer: Medicaid Other | Source: Ambulatory Visit | Attending: Obstetrics and Gynecology | Admitting: Obstetrics and Gynecology

## 2018-06-01 ENCOUNTER — Other Ambulatory Visit: Payer: Self-pay | Admitting: Obstetrics and Gynecology

## 2018-06-01 DIAGNOSIS — N83201 Unspecified ovarian cyst, right side: Secondary | ICD-10-CM

## 2018-06-01 DIAGNOSIS — D259 Leiomyoma of uterus, unspecified: Secondary | ICD-10-CM | POA: Insufficient documentation

## 2018-06-02 ENCOUNTER — Telehealth: Payer: Self-pay | Admitting: *Deleted

## 2018-06-02 NOTE — Telephone Encounter (Signed)
-----   Message from Chancy Milroy, MD sent at 06/02/2018  9:44 AM EDT ----- Please let pt know that the cyst on her right ovary has resolved. Nl GYN U/S. Thanks Legrand Como

## 2018-06-02 NOTE — Telephone Encounter (Signed)
Called pt to inform her of her U/S results.  Pt did not pick up.  No voicemail box was set up and a message was not able to be left.  A letter was sent to the pt.

## 2018-10-26 ENCOUNTER — Encounter (HOSPITAL_COMMUNITY): Payer: Self-pay | Admitting: *Deleted

## 2018-10-26 ENCOUNTER — Emergency Department (HOSPITAL_COMMUNITY)
Admission: EM | Admit: 2018-10-26 | Discharge: 2018-10-26 | Disposition: A | Discharge status: Home / Self Care | Payer: Medicaid Other | Attending: Emergency Medicine | Admitting: Emergency Medicine

## 2018-10-26 DIAGNOSIS — J45909 Unspecified asthma, uncomplicated: Secondary | ICD-10-CM | POA: Insufficient documentation

## 2018-10-26 DIAGNOSIS — B9789 Other viral agents as the cause of diseases classified elsewhere: Secondary | ICD-10-CM

## 2018-10-26 DIAGNOSIS — J069 Acute upper respiratory infection, unspecified: Secondary | ICD-10-CM | POA: Insufficient documentation

## 2018-10-26 DIAGNOSIS — Z87891 Personal history of nicotine dependence: Secondary | ICD-10-CM | POA: Insufficient documentation

## 2018-10-26 NOTE — ED Triage Notes (Signed)
Pt in c/o non productive cough onset x 5 days with generalized body aches, headache, and nausea, denies v/d, denies sore throat and ear pain, A&O  x4

## 2018-10-26 NOTE — Discharge Instructions (Addendum)
Please read attached information. If you experience any new or worsening signs or symptoms please return to the emergency room for evaluation. Please follow-up with your primary care provider or specialist as discussed.  °

## 2018-10-26 NOTE — ED Notes (Signed)
Patient verbalizes understanding of discharge instructions. Opportunity for questioning and answers were provided. Armband removed by staff, pt discharged from ED.  

## 2018-10-26 NOTE — ED Provider Notes (Signed)
[facility] EMERGENCY DEPARTMENT Provider Note   [name]: [id number] Arrival date & time: 10/26/18  1039   History   Chief Complaint Chief Complaint  Patient presents with  . Nausea    HPI Jessica Fitzpatrick is a 23 y.o. female.  HPI   38 YOF presents today with complaints of aches and fatigue.  Patient notes that Jessica Fitzpatrick 5 days ago she developed generalized body aches nonproductive cough, headache, nausea and nasal congestion.  She denies any vomiting or diarrhea, denies any significant sore throat.  She notes no medications prior to arrival.  She notes a remote history of asthma but does not need an inhaler use an inhaler.  She also reports a close sick contact at home with similar symptoms.    Past Medical History:  Diagnosis Date  . Asthma Jan 2013   no occurences since then  . Headache(784.0)    associated with menses  . Hidradenitis axillaris 04/2013    Patient Active Problem List   Diagnosis Date Noted  . Fibroids 05/01/2018  . Ovarian cyst 05/01/2018  . Pelvic pain 05/01/2018  . Failed vision screen 05/23/2013  . Failed hearing screening 05/23/2013  . Body mass index, pediatric, 85th percentile to less than 95th percentile for age 80/06/2013    Past Surgical History:  Procedure Laterality Date  . INCISION AND DRAINAGE ABSCESS Bilateral 05/10/2013   Procedure: INCISION AND DRAINAGE of bilateral axillary hydradenitis;  Surgeon: Leighton Ruff, MD;  Location: [facility] ORS;  Service: General;  Laterality: Bilateral;     OB History    Gravida  0   Para  0   Term  0   Preterm  0   AB  0   Living  0     SAB  0   TAB  0   Ectopic  0   Multiple  0   Live Births  0            Home Medications    Prior to Admission medications   Medication Sig Start Date End Date Taking? Authorizing Provider  acetaminophen (TYLENOL 8 HOUR) 650 MG CR tablet Take 1 tablet (650 mg total) by mouth every 8 (eight) hours as needed. 03/22/18   Varney Biles,  MD  albuterol (PROVENTIL HFA;VENTOLIN HFA) 108 (90 BASE) MCG/ACT inhaler Inhale 2 puffs into the lungs every 6 (six) hours as needed for wheezing or shortness of breath.    [provider]    Family History Family History  Problem Relation Age of Onset  . Cancer Maternal Aunt        Breast. diagnosed young  . Cancer Maternal Grandmother        Breast  . Lupus Brother     Social History Social History   Tobacco Use  . Smoking status: Former Smoker    Last attempt to quit: 05/02/2013    Years since quitting: 5.4  . Smokeless tobacco: Never Used  . Tobacco comment: mom smokes  Substance Use Topics  . Alcohol use: Yes    Alcohol/week: 2.0 standard drinks    Types: 2 Cans of beer per week  . Drug use: No     Allergies   Ibuprofen   Review of Systems Review of Systems  All other systems reviewed and are negative.   Physical Exam Updated Vital Signs BP 117/88 (BP Location: Right Arm)   Pulse (!) 107   Temp 98.3 F (36.8 C) (Oral)   Resp 18   Ht  5\' 3"  (1.6 m)   Wt 64.9 kg   LMP 10/14/2018 (Approximate)   SpO2 100%   BMI 25.33 kg/m   Physical Exam Vitals signs and nursing note reviewed.  Constitutional:      Appearance: She is well-developed.  HENT:     Head: Normocephalic and atraumatic.     Comments: Oropharynx clear no swelling erythema edema or exudate-bilateral TMs normal Eyes:     General: No scleral icterus.       Right eye: No discharge.        Left eye: No discharge.     Conjunctiva/sclera: Conjunctivae normal.     Pupils: Pupils are equal, round, and reactive to light.  Neck:     Musculoskeletal: Normal range of motion.     Vascular: No JVD.     Trachea: No tracheal deviation.  Pulmonary:     Effort: Pulmonary effort is normal. No respiratory distress.     Breath sounds: No stridor. No wheezing, rhonchi or rales.  Chest:     Chest wall: No tenderness.  Neurological:     Mental Status: She is alert and oriented to person, place, and  time.     Coordination: Coordination normal.  Psychiatric:        Behavior: Behavior normal.        Thought Content: Thought content normal.        Judgment: Judgment normal.     ED Treatments / Results  Labs (all labs ordered are listed, but only abnormal results are displayed) Labs Reviewed - No data to display  EKG None  Radiology No results found.  Procedures Procedures (including critical care time)  Medications Ordered in ED Medications - No data to display   Initial Impression / Assessment and Plan / ED Course  I have reviewed the triage vital signs and the nursing notes.  Pertinent labs & imaging results that were available during my care of the patient were reviewed by me and considered in my medical decision making (see chart for details).     Labs:   Imaging:  Consults:  Therapeutics:  Discharge Meds:   Assessment/Plan: 52 YOF presents today with likely viral URI.  She is very well-appearing in no acute distress.  She is afebrile.  Lungs are clear no signs of pneumonia.  Very low suspicion for influenza.  Discharged with symptomatic care and strict return precautions.  She verbalized understanding and agreement to today's plan.    Final Clinical Impressions(s) / ED Diagnoses   Final diagnoses:  Viral URI with cough    ED Discharge Orders    None       Jessica Fitzpatrick 10/26/18 1157    Jessica Rasmussen, MD 10/26/18 [phone]

## 2019-03-14 ENCOUNTER — Emergency Department (HOSPITAL_COMMUNITY)
Admission: EM | Admit: 2019-03-14 | Discharge: 2019-03-14 | Disposition: A | Discharge status: Home / Self Care | Payer: Self-pay | Attending: Emergency Medicine | Admitting: Emergency Medicine

## 2019-03-14 ENCOUNTER — Other Ambulatory Visit: Payer: Self-pay

## 2019-03-14 ENCOUNTER — Encounter (HOSPITAL_COMMUNITY): Payer: Self-pay | Admitting: Emergency Medicine

## 2019-03-14 DIAGNOSIS — Z87891 Personal history of nicotine dependence: Secondary | ICD-10-CM | POA: Insufficient documentation

## 2019-03-14 DIAGNOSIS — Z202 Contact with and (suspected) exposure to infections with a predominantly sexual mode of transmission: Secondary | ICD-10-CM | POA: Insufficient documentation

## 2019-03-14 MED ORDER — ACETAMINOPHEN 325 MG PO TABS
650.0000 mg | ORAL_TABLET | Freq: Once | ORAL | Status: AC
  Administered 2019-03-14: 650 mg via ORAL
  Filled 2019-03-14: qty 2

## 2019-03-14 MED ORDER — AZITHROMYCIN 250 MG PO TABS: 1000.0000 mg | ORAL_TABLET | Freq: Once | ORAL | Status: DC

## 2019-03-14 MED ORDER — METRONIDAZOLE 500 MG PO TABS
2000.0000 mg | ORAL_TABLET | Freq: Once | ORAL | Status: AC
  Administered 2019-03-14: 2000 mg via ORAL
  Filled 2019-03-14: qty 4

## 2019-03-14 MED ORDER — CEFTRIAXONE SODIUM 250 MG IJ SOLR: 250.0000 mg | Freq: Once | INTRAMUSCULAR | Status: DC

## 2019-03-14 NOTE — ED Triage Notes (Addendum)
Patient reports that her girlfriend found out she is positive trichomoniasis and so she thinks it might be in her mouth and wants to get tested

## 2019-03-14 NOTE — ED Notes (Signed)
Patient verbalizes understanding of discharge instructions. Opportunity for questioning and answers were provided. Armband removed by staff, pt discharged from ED.  

## 2019-03-14 NOTE — ED Provider Notes (Signed)
[facility] EMERGENCY DEPARTMENT Provider Note   [name]: [id number] Arrival date & time: 03/14/19  1542     History   Chief Complaint Chief Complaint  Patient presents with  . Exposure to STD    HPI Jessica Fitzpatrick is a 23 y.o. female.     The history is provided by the patient. No language interpreter was used.  Exposure to STD     23 year old female presenting with concern of potential STI exposure.  Patient reports her girlfriend recently notified her that she tested positive for trichomonas last week after she developed vaginal irritation and was evaluated.  Patient states she only perform oral sex and she has never had vaginal sex in the past.  She does not have any specific symptoms's, specifically no sore throat, vaginal itching, vaginal discharge or odor or dysuria.  She is here requesting for treatment of trichomonas.  She does have menstrual period.   Past Medical History:  Diagnosis Date  . Asthma Jan 2013   no occurences since then  . Headache(784.0)    associated with menses  . Hidradenitis axillaris 04/2013    Patient Active Problem List   Diagnosis Date Noted  . Fibroids 05/01/2018  . Ovarian cyst 05/01/2018  . Pelvic pain 05/01/2018  . Failed vision screen 05/23/2013  . Failed hearing screening 05/23/2013  . Body mass index, pediatric, 85th percentile to less than 95th percentile for age 46/06/2013    Past Surgical History:  Procedure Laterality Date  . INCISION AND DRAINAGE ABSCESS Bilateral 05/10/2013   Procedure: INCISION AND DRAINAGE of bilateral axillary hydradenitis;  Surgeon: Leighton Ruff, MD;  Location: [facility];  Service: General;  Laterality: Bilateral;     OB History    Gravida  0   Para  0   Term  0   Preterm  0   AB  0   Living  0     SAB  0   TAB  0   Ectopic  0   Multiple  0   Live Births  0            Home Medications    Prior to Admission medications   Medication Sig Start Date End Date  Taking? Authorizing Provider  acetaminophen (TYLENOL 8 HOUR) 650 MG CR tablet Take 1 tablet (650 mg total) by mouth every 8 (eight) hours as needed. 03/22/18   Varney Biles, MD  albuterol (PROVENTIL HFA;VENTOLIN HFA) 108 (90 BASE) MCG/ACT inhaler Inhale 2 puffs into the lungs every 6 (six) hours as needed for wheezing or shortness of breath.    [provider]    Family History Family History  Problem Relation Age of Onset  . Cancer Maternal Aunt        Breast. diagnosed young  . Cancer Maternal Grandmother        Breast  . Lupus Brother     Social History Social History   Tobacco Use  . Smoking status: Former Smoker    Quit date: 05/02/2013    Years since quitting: 5.8  . Smokeless tobacco: Never Used  . Tobacco comment: mom smokes  Substance Use Topics  . Alcohol use: Yes    Alcohol/week: 2.0 standard drinks    Types: 2 Cans of beer per week  . Drug use: No     Allergies   Ibuprofen   Review of Systems Review of Systems  All other systems reviewed and are negative.    Physical Exam Updated  Vital Signs BP 115/67 (BP Location: Right Arm)   Pulse 70   Temp 98 F (36.7 C) (Oral)   Resp 18   Ht 5\' 1"  (1.549 m)   Wt 65 kg   SpO2 98%   BMI 27.08 kg/m   Physical Exam Vitals signs and nursing note reviewed.  Constitutional:      General: She is not in acute distress.    Appearance: She is well-developed.  HENT:     Head: Atraumatic.     Mouth/Throat:     Mouth: Mucous membranes are moist.     Pharynx: No oropharyngeal exudate or posterior oropharyngeal erythema.  Eyes:     Conjunctiva/sclera: Conjunctivae normal.  Neck:     Musculoskeletal: Neck supple.  Cardiovascular:     Rate and Rhythm: Normal rate and regular rhythm.     Pulses: Normal pulses.     Heart sounds: Normal heart sounds.  Pulmonary:     Effort: Pulmonary effort is normal.     Breath sounds: Normal breath sounds.  Abdominal:     Palpations: Abdomen is soft.      Tenderness: There is no abdominal tenderness.  Genitourinary:    Comments: Pelvic examination is deferred Skin:    Findings: No rash.  Neurological:     Mental Status: She is alert.      ED Treatments / Results  Labs (all labs ordered are listed, but only abnormal results are displayed) Labs Reviewed - No data to display  EKG None  Radiology No results found.  Procedures Procedures (including critical care time)  Medications Ordered in ED Medications  metroNIDAZOLE (FLAGYL) tablet 2,000 mg (has no administration in time range)  acetaminophen (TYLENOL) tablet 650 mg (has no administration in time range)     Initial Impression / Assessment and Plan / ED Course  I have reviewed the triage vital signs and the nursing notes.  Pertinent labs & imaging results that were available during my care of the patient were reviewed by me and considered in my medical decision making (see chart for details).        BP 115/67 (BP Location: Right Arm)   Pulse 70   Temp 98 F (36.7 C) (Oral)   Resp 18   Ht 5\' 1"  (1.549 m)   Wt 65 kg   SpO2 98%   BMI 27.08 kg/m    Final Clinical Impressions(s) / ED Diagnoses   Final diagnoses:  Exposure to STD    ED Discharge Orders    None     4:59 PM Patient reported that her girlfriend was tested positive for trichomonas a few days ago and she would like to be treated for trichomonas as well.  She does not have any specific symptoms at this time.  She declined pelvic examination or other STI testing.  Throat exam is unremarkable.  We will give Flagyl 2 g oral here.  She also requests some medication for abdominal cramping, Tylenol given.  Her abdomen is soft and nontender on exam.   Domenic Moras, PA-C 03/14/19 1736    Carmin Muskrat, MD 03/14/19 2337

## 2019-07-10 ENCOUNTER — Other Ambulatory Visit: Payer: Self-pay

## 2019-07-10 ENCOUNTER — Emergency Department (HOSPITAL_COMMUNITY)
Admission: EM | Admit: 2019-07-10 | Discharge: 2019-07-11 | Disposition: A | Discharge status: Home / Self Care | Payer: Medicaid Other | Attending: Emergency Medicine | Admitting: Emergency Medicine

## 2019-07-10 ENCOUNTER — Encounter (HOSPITAL_COMMUNITY): Payer: Self-pay | Admitting: Emergency Medicine

## 2019-07-10 DIAGNOSIS — R102 Pelvic and perineal pain: Secondary | ICD-10-CM | POA: Insufficient documentation

## 2019-07-10 DIAGNOSIS — Z5321 Procedure and treatment not carried out due to patient leaving prior to being seen by health care provider: Secondary | ICD-10-CM | POA: Insufficient documentation

## 2019-07-10 NOTE — ED Triage Notes (Signed)
Pt reports abnormally heavy period, soaking approx 3 pads per hour with pelvic pain rated 8/10. Pt reports trying ibuprofen and muscle relaxant with no relief.

## 2019-07-11 NOTE — ED Notes (Signed)
Pt not visible in lobby and no answer for vitals recheck 3x

## 2019-11-21 ENCOUNTER — Ambulatory Visit (HOSPITAL_COMMUNITY)
Admission: EM | Admit: 2019-11-21 | Discharge: 2019-11-21 | Disposition: A | Discharge status: Home / Self Care | Payer: Medicaid Other | Attending: Internal Medicine | Admitting: Internal Medicine

## 2019-11-21 ENCOUNTER — Other Ambulatory Visit: Payer: Self-pay

## 2019-11-21 ENCOUNTER — Encounter (HOSPITAL_COMMUNITY): Payer: Self-pay

## 2019-11-21 DIAGNOSIS — Z1152 Encounter for screening for COVID-19: Secondary | ICD-10-CM | POA: Diagnosis not present

## 2019-11-21 DIAGNOSIS — Z20822 Contact with and (suspected) exposure to covid-19: Secondary | ICD-10-CM

## 2019-11-21 NOTE — ED Triage Notes (Signed)
Pt states she needs a Covid test for work.

## 2019-11-21 NOTE — ED Provider Notes (Signed)
[facility]    [name]: [id number] Arrival date & time: 11/21/19  1739      History   Chief Complaint Chief Complaint  Patient presents with  . Covid test    HPI Jessica Fitzpatrick is a 24 y.o. female comes to urgent care for Covid testing.  Patient needs a Covid test for work.  He has no symptoms.  No close exposures.Marland Kitchen   HPI  Past Medical History:  Diagnosis Date  . Asthma Jan 2013   no occurences since then  . Headache(784.0)    associated with menses  . Hidradenitis axillaris 04/2013    Patient Active Problem List   Diagnosis Date Noted  . Fibroids 05/01/2018  . Ovarian cyst 05/01/2018  . Pelvic pain 05/01/2018  . Failed vision screen 05/23/2013  . Failed hearing screening 05/23/2013  . Body mass index, pediatric, 85th percentile to less than 95th percentile for age 28/06/2013    Past Surgical History:  Procedure Laterality Date  . INCISION AND DRAINAGE ABSCESS Bilateral 05/10/2013   Procedure: INCISION AND DRAINAGE of bilateral axillary hydradenitis;  Surgeon: Leighton Ruff, MD;  Location: [facility];  Service: General;  Laterality: Bilateral;    OB History    Gravida  0   Para  0   Term  0   Preterm  0   AB  0   Living  0     SAB  0   TAB  0   Ectopic  0   Multiple  0   Live Births  0            Home Medications    Prior to Admission medications   Medication Sig Start Date End Date Taking? Authorizing Provider  acetaminophen (TYLENOL 8 HOUR) 650 MG CR tablet Take 1 tablet (650 mg total) by mouth every 8 (eight) hours as needed. 03/22/18   Varney Biles, MD  albuterol (PROVENTIL HFA;VENTOLIN HFA) 108 (90 BASE) MCG/ACT inhaler Inhale 2 puffs into the lungs every 6 (six) hours as needed for wheezing or shortness of breath.    [provider]    Family History Family History  Problem Relation Age of Onset  . Cancer Maternal Aunt        Breast. diagnosed young  . Cancer Maternal Grandmother        Breast  . Lupus  Brother     Social History Social History   Tobacco Use  . Smoking status: Former Smoker    Quit date: 05/02/2013    Years since quitting: 6.5  . Smokeless tobacco: Never Used  . Tobacco comment: mom smokes  Substance Use Topics  . Alcohol use: Yes    Alcohol/week: 2.0 standard drinks    Types: 2 Cans of beer per week  . Drug use: Yes    Types: Marijuana     Allergies   Ibuprofen   Review of Systems Review of Systems   Physical Exam Triage Vital Signs ED Triage Vitals  Enc Vitals Group     BP 11/21/19 1821 109/77     Pulse Rate 11/21/19 1821 76     Resp 11/21/19 1821 16     Temp 11/21/19 1821 99.1 F (37.3 C)     Temp Source 11/21/19 1821 Oral     SpO2 11/21/19 1821 100 %     Weight 11/21/19 1820 165 lb (74.8 kg)     Height --      Head Circumference --  Peak Flow --      Pain Score 11/21/19 1820 0     Pain Loc --      Pain Edu? --      Excl. in [place]? --    No data found.  Updated Vital Signs BP 109/77 (BP Location: Right Arm)   Pulse 76   Temp 99.1 F (37.3 C) (Oral)   Resp 16   Wt 74.8 kg   LMP 10/25/2019   SpO2 100%   BMI 29.23 kg/m   Visual Acuity Right Eye Distance:   Left Eye Distance:   Bilateral Distance:    Right Eye Near:   Left Eye Near:    Bilateral Near:     Physical Exam Vitals and nursing note reviewed.  Constitutional:      Appearance: Normal appearance.  Skin:    Capillary Refill: Capillary refill takes less than 2 seconds.  Neurological:     General: No focal deficit present.     Mental Status: She is alert and oriented to person, place, and time.      UC Treatments / Results  Labs (all labs ordered are listed, but only abnormal results are displayed) Labs Reviewed  SARS CORONAVIRUS 2 (TAT 6-24 HRS)    EKG   Radiology No results found.  Procedures Procedures (including critical care time)  Medications Ordered in UC Medications - No data to display  Initial Impression / Assessment and Plan / UC  Course  I have reviewed the triage vital signs and the nursing notes.  Pertinent labs & imaging results that were available during my care of the patient were reviewed by me and considered in my medical decision making (see chart for details).     1.  COVID-19 screening for work: COVID-19 PCR test sent Patient is encouraged to download MyChart app to check his results If the patient's results are positive we will reach out to the patient. Final Clinical Impressions(s) / UC Diagnoses   Final diagnoses:  Encounter for screening for COVID-19   Discharge Instructions   None    ED Prescriptions    None     PDMP not reviewed this encounter.   Chase Picket, MD 11/21/19 [phone]

## 2019-11-23 LAB — NOVEL CORONAVIRUS, NAA (HOSP ORDER, SEND-OUT TO REF LAB; TAT 18-24 HRS): SARS-CoV-2, NAA: NOT DETECTED

## 2019-11-28 ENCOUNTER — Emergency Department (HOSPITAL_COMMUNITY)
Admission: EM | Admit: 2019-11-28 | Discharge: 2019-11-28 | Disposition: A | Discharge status: Home / Self Care | Payer: Self-pay | Attending: Emergency Medicine | Admitting: Emergency Medicine

## 2019-11-28 ENCOUNTER — Emergency Department (HOSPITAL_COMMUNITY): Payer: Self-pay

## 2019-11-28 ENCOUNTER — Other Ambulatory Visit: Payer: Self-pay

## 2019-11-28 DIAGNOSIS — M25512 Pain in left shoulder: Secondary | ICD-10-CM | POA: Insufficient documentation

## 2019-11-28 DIAGNOSIS — Z87891 Personal history of nicotine dependence: Secondary | ICD-10-CM | POA: Insufficient documentation

## 2019-11-28 DIAGNOSIS — Z79899 Other long term (current) drug therapy: Secondary | ICD-10-CM | POA: Insufficient documentation

## 2019-11-28 DIAGNOSIS — J45909 Unspecified asthma, uncomplicated: Secondary | ICD-10-CM | POA: Insufficient documentation

## 2019-11-28 NOTE — Discharge Instructions (Addendum)
You may start taking Tylenol as needed for pain control. You may take [id number] mg of Tylenol every 6 hours. Do not exceed 4000 mg of Tylenol daily as this can lead to liver damage. You may use warm and cold compresses to help with your symptoms.   Please follow up with your primary doctor or with the orthopedic doctor within the next 7-10 days for re-evaluation and further treatment of your symptoms.   Please return to the ER sooner if you have any new or worsening symptoms.

## 2019-11-28 NOTE — ED Triage Notes (Signed)
Pt here for evaluation of L shoulder pain since last night. Sts his work sent him home saying he should come here for eval and clearance to come back to work. Denies specific injury, just endorses it gradually got worse throughout the night. Hx of shoulder problems/injury/dislocations.

## 2019-11-28 NOTE — ED Provider Notes (Signed)
[facility] EMERGENCY DEPARTMENT Provider Note   [name]: [id number] Arrival date & time: 11/28/19  1007     History Chief Complaint  Patient presents with  . Shoulder Pain    Jessica Fitzpatrick is a 24 y.o. female.  HPI   24 year old female with a history of asthma, headache, hidradenitis, who presents the emergency department today complaining of left shoulder pain.  Patient states she has a history of chronic intermittent left shoulder pain that she has had since about seventh grade after an injury during a sporting event.  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  She frequently has exacerbations when she overuses the arm.  States that for the last few days she has had pain to the lateral part of the left shoulder.  Denies any injury or trauma.  It feels consistent with prior exacerbations.  She currently rates her pain 5/10.  States she was at work yesterday and they sent her home and told her she needed to get evaluated.  She denies any swelling, redness or warmth to the joint.  Denies any fevers.  Past Medical History:  Diagnosis Date  . Asthma Jan 2013   no occurences since then  . Headache(784.0)    associated with menses  . Hidradenitis axillaris 04/2013    Patient Active Problem List   Diagnosis Date Noted  . Fibroids 05/01/2018  . Ovarian cyst 05/01/2018  . Pelvic pain 05/01/2018  . Failed vision screen 05/23/2013  . Failed hearing screening 05/23/2013  . Body mass index, pediatric, 85th percentile to less than 95th percentile for age 50/06/2013    Past Surgical History:  Procedure Laterality Date  . INCISION AND DRAINAGE ABSCESS Bilateral 05/10/2013   Procedure: INCISION AND DRAINAGE of bilateral axillary hydradenitis;  Surgeon: Leighton Ruff, MD;  Location: [facility];  Service: General;  Laterality: Bilateral;     OB History    Gravida  0   Para  0   Term  0   Preterm  0   AB  0   Living  0     SAB  0   TAB  0   Ectopic  0     Multiple  0   Live Births  0           Family History  Problem Relation Age of Onset  . Cancer Maternal Aunt        Breast. diagnosed young  . Cancer Maternal Grandmother        Breast  . Lupus Brother     Social History   Tobacco Use  . Smoking status: Former Smoker    Quit date: 05/02/2013    Years since quitting: 6.5  . Smokeless tobacco: Never Used  . Tobacco comment: mom smokes  Substance Use Topics  . Alcohol use: Yes    Alcohol/week: 2.0 standard drinks    Types: 2 Cans of beer per week  . Drug use: Yes    Types: Marijuana    Home Medications Prior to Admission medications   Medication Sig Start Date End Date Taking? Authorizing Provider  acetaminophen (TYLENOL 8 HOUR) 650 MG CR tablet Take 1 tablet (650 mg total) by mouth every 8 (eight) hours as needed. 03/22/18   Varney Biles, MD  albuterol (PROVENTIL HFA;VENTOLIN HFA) 108 (90 BASE) MCG/ACT inhaler Inhale 2 puffs into the lungs every 6 (six) hours as needed for wheezing or shortness of breath.    [provider]    Allergies  Ibuprofen  Review of Systems   Review of Systems  Constitutional: Negative for fever.  Respiratory: Negative for shortness of breath.   Cardiovascular: Negative for chest pain.  Musculoskeletal: Negative for joint swelling.       Left shoulder pain  Skin: Negative for color change.  Neurological: Negative for weakness and numbness.    Physical Exam Updated Vital Signs BP 111/69   Pulse 91   Temp 98.3 F (36.8 C) (Oral)   Resp 16   Ht 5\' 3"  (1.6 m)   Wt 65.8 kg   SpO2 98%   BMI 25.69 kg/m   Physical Exam Vitals and nursing note reviewed.  Constitutional:      General: She is not in acute distress.    Appearance: She is well-developed.  HENT:     Head: Normocephalic and atraumatic.  Eyes:     Conjunctiva/sclera: Conjunctivae normal.  Cardiovascular:     Rate and Rhythm: Normal rate.  Pulmonary:     Effort: Pulmonary effort is normal.   Musculoskeletal:        General: Normal range of motion.     Cervical back: Neck supple.     Comments: TTP to the left lateral shoulder. Able to lift LUE above head. Negative empty can test. + crossover and hawkins testing. No erythema, swelling or warmth to the joint.   Skin:    General: Skin is warm and dry.  Neurological:     Mental Status: She is alert.     ED Results / Procedures / Treatments   Labs (all labs ordered are listed, but only abnormal results are displayed) Labs Reviewed - No data to display  EKG None  Radiology DG Shoulder Left  Result Date: 11/28/2019 CLINICAL DATA:  Acute left shoulder pain without known injury. EXAM: LEFT SHOULDER - 2+ VIEW COMPARISON:  None. FINDINGS: There is no evidence of fracture or dislocation. There is no evidence of arthropathy or other focal bone abnormality. Soft tissues are unremarkable. IMPRESSION: Negative. Electronically Signed   By: Marijo Conception M.D.   On: 11/28/2019 11:12    Procedures Procedures (including critical care time)  Medications Ordered in ED Medications - No data to display  ED Course  I have reviewed the triage vital signs and the nursing notes.  Pertinent labs & imaging results that were available during my care of the patient were reviewed by me and considered in my medical decision making (see chart for details).    MDM Rules/Calculators/A&P                      24 y/o F with h/o chronic left shoulder pain presenting with acute exacerbation of pain. No falls or injury but she states she think she overused it leading to her pain. She is here because her work told her she needed to get checked out. No evidence of septic joint on exam. Neg empty can testing. + crossover and hawkins testing. Xray reviewed/interpreted - no acute bony abnormality. Suspect impingement syndrome. Will give sling. Will give ortho f/u. Advised on return precautions . She voices understanding and is in agreement with the plan. All  questions answered, pt stable for discharge.   Final Clinical Impression(s) / ED Diagnoses Final diagnoses:  Acute pain of left shoulder    Rx / DC Orders ED Discharge Orders    None       Bishop Dublin 11/28/19 1120    Valarie Merino, MD 12/02/19  1058  

## 2019-12-06 ENCOUNTER — Ambulatory Visit: Payer: Medicaid Other | Admitting: Orthopaedic Surgery

## 2019-12-19 ENCOUNTER — Ambulatory Visit: Payer: Medicaid Other | Admitting: Orthopaedic Surgery

## 2020-02-24 ENCOUNTER — Emergency Department (HOSPITAL_COMMUNITY)
Admission: EM | Admit: 2020-02-24 | Discharge: 2020-02-24 | Disposition: A | Discharge status: Home / Self Care | Payer: Medicaid Other | Attending: Emergency Medicine | Admitting: Emergency Medicine

## 2020-02-24 ENCOUNTER — Encounter (HOSPITAL_COMMUNITY): Payer: Self-pay | Admitting: Emergency Medicine

## 2020-02-24 ENCOUNTER — Other Ambulatory Visit: Payer: Self-pay

## 2020-02-24 DIAGNOSIS — N6310 Unspecified lump in the right breast, unspecified quadrant: Secondary | ICD-10-CM | POA: Insufficient documentation

## 2020-02-24 DIAGNOSIS — J45909 Unspecified asthma, uncomplicated: Secondary | ICD-10-CM | POA: Insufficient documentation

## 2020-02-24 DIAGNOSIS — F17210 Nicotine dependence, cigarettes, uncomplicated: Secondary | ICD-10-CM | POA: Insufficient documentation

## 2020-02-24 DIAGNOSIS — N6311 Unspecified lump in the right breast, upper outer quadrant: Secondary | ICD-10-CM

## 2020-02-24 NOTE — ED Provider Notes (Signed)
[facility] DEPARTMENT Provider Note   [name]: [id number] Arrival date & time: 02/24/20  1730     History Chief Complaint  Patient presents with   lump in breast    Jessica Fitzpatrick is a 24 y.o. female.  HPI She presents for evaluation of a lump of her right breast that she has noticed over the last week.  It is mildly tender, but today is not hurting.  She has previously had some drainage from her left nipple, but none from the right.  She denies weight loss, dizziness, weakness, anorexia, headache, shortness of breath or chest pain.  There are no other known modifying factors.    Past Medical History:  Diagnosis Date   Asthma Jan 2013   no occurences since then   Headache(784.0)    associated with menses   Hidradenitis axillaris 04/2013    Patient Active Problem List   Diagnosis Date Noted   Fibroids 05/01/2018   Ovarian cyst 05/01/2018   Pelvic pain 05/01/2018   Failed vision screen 05/23/2013   Failed hearing screening 05/23/2013   Body mass index, pediatric, 85th percentile to less than 95th percentile for age 33/06/2013    Past Surgical History:  Procedure Laterality Date   INCISION AND DRAINAGE ABSCESS Bilateral 05/10/2013   Procedure: INCISION AND DRAINAGE of bilateral axillary hydradenitis;  Surgeon: Leighton Ruff, MD;  Location: [facility];  Service: General;  Laterality: Bilateral;     OB History    Gravida  0   Para  0   Term  0   Preterm  0   AB  0   Living  0     SAB  0   TAB  0   Ectopic  0   Multiple  0   Live Births  0           Family History  Problem Relation Age of Onset   Cancer Maternal Aunt        Breast. diagnosed young   Cancer Maternal Grandmother        Breast   Lupus Brother     Social History   Tobacco Use   Smoking status: Current Every Day Smoker   Smokeless tobacco: Never Used   Tobacco comment: mom smokes  Vaping Use   Vaping Use: Never used  Substance Use  Topics   Alcohol use: Yes    Alcohol/week: 2.0 standard drinks    Types: 2 Cans of beer per week   Drug use: Yes    Types: Marijuana    Home Medications Prior to Admission medications   Medication Sig Start Date End Date Taking? Authorizing Provider  acetaminophen (TYLENOL 8 HOUR) 650 MG CR tablet Take 1 tablet (650 mg total) by mouth every 8 (eight) hours as needed. 03/22/18   Varney Biles, MD  albuterol (PROVENTIL HFA;VENTOLIN HFA) 108 (90 BASE) MCG/ACT inhaler Inhale 2 puffs into the lungs every 6 (six) hours as needed for wheezing or shortness of breath.    [provider]    Allergies    Ibuprofen  Review of Systems   Review of Systems  All other systems reviewed and are negative.   Physical Exam Updated Vital Signs BP 120/74 (BP Location: Right Arm)    Pulse (!) 111    Temp 98.6 F (37 C) (Oral)    Resp 18    LMP 01/25/2020    SpO2 100%   Physical Exam Vitals and nursing note reviewed.  Constitutional:  General: She is not in acute distress.    Appearance: She is well-developed. She is not ill-appearing, toxic-appearing or diaphoretic.  HENT:     Head: Normocephalic and atraumatic.     Right Ear: External ear normal.     Left Ear: External ear normal.  Eyes:     Conjunctiva/sclera: Conjunctivae normal.     Pupils: Pupils are equal, round, and reactive to light.  Neck:     Trachea: Phonation normal.  Cardiovascular:     Rate and Rhythm: Normal rate.  Pulmonary:     Effort: Pulmonary effort is normal.     Comments: Breast exam: Bilateral fibrocystic breast disease consistency.  Palpable mass, site of patient concern, located at 10:00 near the right areola.  This area is an indistinct 1 x 1.5 mass, similar to consistency of other cystic breast changes.  This area is slightly tender.  During the breast exam on the right there was a small amount of drainage from the nipple, which appears physiologic.  No associated blood or purulence was expressed.   Neither breast is is otherwise tender to palpation. Chest:     Chest wall: No tenderness.  Abdominal:     General: There is no distension.     Palpations: Abdomen is soft.  Musculoskeletal:        General: Normal range of motion.     Cervical back: Normal range of motion and neck supple.  Skin:    General: Skin is warm and dry.  Neurological:     Mental Status: She is alert and oriented to person, place, and time.     Cranial Nerves: No cranial nerve deficit.     Sensory: No sensory deficit.     Motor: No abnormal muscle tone.     Coordination: Coordination normal.  Psychiatric:        Mood and Affect: Mood normal.        Behavior: Behavior normal.        Thought Content: Thought content normal.        Judgment: Judgment normal.     ED Results / Procedures / Treatments   Labs (all labs ordered are listed, but only abnormal results are displayed) Labs Reviewed - No data to display  EKG None  Radiology No results found.  Procedures Procedures (including critical care time)  Medications Ordered in ED Medications - No data to display  ED Course  I have reviewed the triage vital signs and the nursing notes.  Pertinent labs & imaging results that were available during my care of the patient were reviewed by me and considered in my medical decision making (see chart for details).    MDM Rules/Calculators/A&P                           Patient Vitals for the past 24 hrs:  BP Temp Temp src Pulse Resp SpO2  02/24/20 1734 120/74 98.6 F (37 C) Oral (!) 111 18 100 %    7:38 PM Reevaluation with update and discussion. After initial assessment and treatment, an updated evaluation reveals no change in status, findings discussed with patient and all questions were answered. Daleen Bo   Medical Decision Making:  This patient is presenting for evaluation of right breast lump, which does require a range of treatment options, and is a complaint that involves a moderate  risk of morbidity and mortality. The differential diagnoses include breast infection, physiologic breast abnormality, breast cancer.  I decided to review old records, and in summary healthy young female, without prior breast problems but history of breast cancer in the family.  I did not require additional historical information from anyone.    Critical Interventions-clinical evaluation  After These Interventions, the Patient was reevaluated and was found stable for discharge.  Patient with likely fibrocystic breast changes bilaterally, with a small mass of the right breast, which is symptomatic.  Patient can be discharged and managed as an outpatient with referral to the [facility].  They can perform diagnostic testing to stratify and intervene as needed.  Patient also referred for primary care services as needed.  CRITICAL CARE-no Performed by: Daleen Bo  Nursing Notes Reviewed/ Care Coordinated Applicable Imaging Reviewed Interpretation of Laboratory Data incorporated into ED treatment  The patient appears reasonably screened and/or stabilized for discharge and I doubt any other medical condition or other [facility] requiring further screening, evaluation, or treatment in the ED at this time prior to discharge.  Plan: Home Medications-Tylenol if needed for pain; Home Treatments-rest, fluids; return here if the recommended treatment, does not improve the symptoms; Recommended follow up-follow-up with breast center for diagnostic testing and confirmation     Final Clinical Impression(s) / ED Diagnoses Final diagnoses:  None    Rx / DC Orders ED Discharge Orders    None       Daleen Bo, MD 02/24/20 1942

## 2020-02-24 NOTE — ED Triage Notes (Signed)
C/o lump to R breast x 1 week.  States initially painful but denies pain at present.

## 2020-02-24 NOTE — Discharge Instructions (Addendum)
Call the breast center for an appointment to be seen about the lump of the right breast.  If needed, follow-up with the [facility] for primary care services.  It is important to follow-up regarding the lump since you have a history of breast cancer in the family.  The breast lump may be related to fibrocystic breast disease.

## 2020-03-04 ENCOUNTER — Ambulatory Visit (HOSPITAL_COMMUNITY)
Admission: EM | Admit: 2020-03-04 | Discharge: 2020-03-04 | Disposition: A | Discharge status: Home / Self Care | Payer: Medicaid Other | Attending: Physician Assistant | Admitting: Physician Assistant

## 2020-03-04 ENCOUNTER — Encounter (HOSPITAL_COMMUNITY): Payer: Self-pay

## 2020-03-04 ENCOUNTER — Other Ambulatory Visit: Payer: Self-pay

## 2020-03-04 DIAGNOSIS — N63 Unspecified lump in unspecified breast: Secondary | ICD-10-CM

## 2020-03-04 NOTE — ED Provider Notes (Signed)
[facility]    [name]: [id number] Arrival date & time: 03/04/20  1035      History   Chief Complaint Chief Complaint  Patient presents with  . Breast Pain    HPI Jessica Fitzpatrick is a 24 y.o. female.   Pt requesting referral to breast center.  Pt seen in ED and referred.  Breast center sent here for referral. Pt reports she has a lump in her right breast.  Pt reports mass is getting smaller   The history is provided by the patient. No language interpreter was used.    Past Medical History:  Diagnosis Date  . Asthma Jan 2013   no occurences since then  . Headache(784.0)    associated with menses  . Hidradenitis axillaris 04/2013    Patient Active Problem List   Diagnosis Date Noted  . Fibroids 05/01/2018  . Ovarian cyst 05/01/2018  . Pelvic pain 05/01/2018  . Failed vision screen 05/23/2013  . Failed hearing screening 05/23/2013  . Body mass index, pediatric, 85th percentile to less than 95th percentile for age 75/06/2013    Past Surgical History:  Procedure Laterality Date  . INCISION AND DRAINAGE ABSCESS Bilateral 05/10/2013   Procedure: INCISION AND DRAINAGE of bilateral axillary hydradenitis;  Surgeon: Leighton Ruff, MD;  Location: [facility];  Service: General;  Laterality: Bilateral;    OB History    Gravida  0   Para  0   Term  0   Preterm  0   AB  0   Living  0     SAB  0   TAB  0   Ectopic  0   Multiple  0   Live Births  0            Home Medications    Prior to Admission medications   Medication Sig Start Date End Date Taking? Authorizing Provider  acetaminophen (TYLENOL 8 HOUR) 650 MG CR tablet Take 1 tablet (650 mg total) by mouth every 8 (eight) hours as needed. 03/22/18   Varney Biles, MD  albuterol (PROVENTIL HFA;VENTOLIN HFA) 108 (90 BASE) MCG/ACT inhaler Inhale 2 puffs into the lungs every 6 (six) hours as needed for wheezing or shortness of breath.    [provider]    Family History Family History   Problem Relation Age of Onset  . Cancer Maternal Aunt        Breast. diagnosed young  . Cancer Maternal Grandmother        Breast  . Lupus Brother     Social History Social History   Tobacco Use  . Smoking status: Current Every Day Smoker  . Smokeless tobacco: Never Used  . Tobacco comment: mom smokes  Vaping Use  . Vaping Use: Never used  Substance Use Topics  . Alcohol use: Yes    Alcohol/week: 2.0 standard drinks    Types: 2 Cans of beer per week  . Drug use: Yes    Types: Marijuana     Allergies   Ibuprofen   Review of Systems Review of Systems  All other systems reviewed and are negative.    Physical Exam Triage Vital Signs ED Triage Vitals  Enc Vitals Group     BP 03/04/20 1048 121/78     Pulse Rate 03/04/20 1048 90     Resp 03/04/20 1048 18     Temp 03/04/20 1048 98.2 F (36.8 C)     Temp Source 03/04/20 1048 Oral  SpO2 03/04/20 1048 100 %     Weight --      Height --      Head Circumference --      Peak Flow --      Pain Score 03/04/20 1050 5     Pain Loc --      Pain Edu? --      Excl. in [place]? --    No data found.  Updated Vital Signs BP 121/78 (BP Location: Right Arm)   Pulse 90   Temp 98.2 F (36.8 C) (Oral)   Resp 18   LMP 02/29/2020   SpO2 100%   Visual Acuity Right Eye Distance:   Left Eye Distance:   Bilateral Distance:    Right Eye Near:   Left Eye Near:    Bilateral Near:     Physical Exam Vitals reviewed.  Constitutional:      Appearance: Normal appearance.  Cardiovascular:     Rate and Rhythm: Normal rate.  Pulmonary:     Effort: Pulmonary effort is normal.  Musculoskeletal:        General: Normal range of motion.     Comments: Small tender area right breast  Skin:    General: Skin is warm.  Neurological:     General: No focal deficit present.     Mental Status: She is alert.      UC Treatments / Results  Labs (all labs ordered are listed, but only abnormal results are displayed) Labs Reviewed -  No data to display  EKG   Radiology No results found.  Procedures Procedures (including critical care time)  Medications Ordered in UC Medications - No data to display  Initial Impression / Assessment and Plan / UC Course  I have reviewed the triage vital signs and the nursing notes.  Pertinent labs & imaging results that were available during my care of the patient were reviewed by me and considered in my medical decision making (see chart for details).     MDM:  Pt referred for [place] and mammo Final Clinical Impressions(s) / UC Diagnoses   Final diagnoses:  Mass of breast  Mass of breast     Discharge Instructions     Return if any problems.    ED Prescriptions    None     PDMP not reviewed this encounter.  An After Visit Summary was printed and given to the patient.    Fransico Meadow, [place] 03/04/20 1141

## 2020-03-04 NOTE — ED Triage Notes (Signed)
Pt presents with pain and lump in right breast; pt was seen a week ago in ED but they would not give her a referral to breast center for imaging.

## 2020-03-04 NOTE — Discharge Instructions (Signed)
Return if any problems.

## 2020-03-07 ENCOUNTER — Telehealth: Payer: Self-pay

## 2020-03-07 ENCOUNTER — Other Ambulatory Visit: Payer: Self-pay

## 2020-03-07 DIAGNOSIS — N6310 Unspecified lump in the right breast, unspecified quadrant: Secondary | ICD-10-CM

## 2020-03-07 NOTE — Telephone Encounter (Signed)
Patient left message on voicemail, requesting a return call. Attempted to contact patient, left message on voicemail, requesting a return call to the office.

## 2020-03-11 ENCOUNTER — Ambulatory Visit: Payer: Self-pay

## 2020-03-20 ENCOUNTER — Other Ambulatory Visit: Payer: Medicaid Other

## 2020-05-19 ENCOUNTER — Other Ambulatory Visit: Payer: Self-pay

## 2020-05-19 ENCOUNTER — Ambulatory Visit (HOSPITAL_COMMUNITY)
Admission: EM | Admit: 2020-05-19 | Discharge: 2020-05-19 | Disposition: A | Discharge status: Home / Self Care | Payer: Medicaid Other | Attending: Family Medicine | Admitting: Family Medicine

## 2020-05-19 ENCOUNTER — Encounter (HOSPITAL_COMMUNITY): Payer: Self-pay

## 2020-05-19 DIAGNOSIS — R519 Headache, unspecified: Secondary | ICD-10-CM | POA: Diagnosis present

## 2020-05-19 DIAGNOSIS — Z20822 Contact with and (suspected) exposure to covid-19: Secondary | ICD-10-CM | POA: Diagnosis not present

## 2020-05-19 DIAGNOSIS — J45909 Unspecified asthma, uncomplicated: Secondary | ICD-10-CM | POA: Insufficient documentation

## 2020-05-19 DIAGNOSIS — F17210 Nicotine dependence, cigarettes, uncomplicated: Secondary | ICD-10-CM | POA: Diagnosis not present

## 2020-05-19 NOTE — ED Provider Notes (Signed)
[facility]    [name]: [id number] Arrival date & time: 05/19/20  1431      History   Chief Complaint Chief Complaint  Patient presents with  . Headache    HPI Jessica Fitzpatrick is a 24 y.o. female.   Here today for 5 days of headache intermittently. Denies fever, chills, blurred vision, confusion, dizziness, congestion. Not taking anything OTC for sxs. Others in household experiencing COVID 19 sxs.      Past Medical History:  Diagnosis Date  . Asthma Jan 2013   no occurences since then  . Headache(784.0)    associated with menses  . Hidradenitis axillaris 04/2013    Patient Active Problem List   Diagnosis Date Noted  . Fibroids 05/01/2018  . Ovarian cyst 05/01/2018  . Pelvic pain 05/01/2018  . Failed vision screen 05/23/2013  . Failed hearing screening 05/23/2013  . Body mass index, pediatric, 85th percentile to less than 95th percentile for age 50/06/2013    Past Surgical History:  Procedure Laterality Date  . INCISION AND DRAINAGE ABSCESS Bilateral 05/10/2013   Procedure: INCISION AND DRAINAGE of bilateral axillary hydradenitis;  Surgeon: Leighton Ruff, MD;  Location: [facility];  Service: General;  Laterality: Bilateral;    OB History    Gravida  0   Para  0   Term  0   Preterm  0   AB  0   Living  0     SAB  0   TAB  0   Ectopic  0   Multiple  0   Live Births  0            Home Medications    Prior to Admission medications   Medication Sig Start Date End Date Taking? Authorizing Provider  acetaminophen (TYLENOL 8 HOUR) 650 MG CR tablet Take 1 tablet (650 mg total) by mouth every 8 (eight) hours as needed. 03/22/18   Varney Biles, MD  albuterol (PROVENTIL HFA;VENTOLIN HFA) 108 (90 BASE) MCG/ACT inhaler Inhale 2 puffs into the lungs every 6 (six) hours as needed for wheezing or shortness of breath.    [provider]    Family History Family History  Problem Relation Age of Onset  . Cancer Maternal Aunt         Breast. diagnosed young  . Cancer Maternal Grandmother        Breast  . Lupus Brother     Social History Social History   Tobacco Use  . Smoking status: Current Every Day Smoker    Types: Cigarettes  . Smokeless tobacco: Never Used  . Tobacco comment: 5 cigs/d  Vaping Use  . Vaping Use: Never used  Substance Use Topics  . Alcohol use: Yes    Comment: occ  . Drug use: Yes    Types: Marijuana     Allergies   Ibuprofen   Review of Systems Review of Systems PER HPI   Physical Exam Triage Vital Signs ED Triage Vitals [05/19/20 1524]  Enc Vitals Group     BP 117/76     Pulse Rate 73     Resp 16     Temp 98.2 F (36.8 C)     Temp Source Oral     SpO2 100 %     Weight 145 lb (65.8 kg)     Height 5\' 4"  (1.626 m)     Head Circumference      Peak Flow      Pain Score 0  Pain Loc      Pain Edu?      Excl. in [place]?    No data found.  Updated Vital Signs BP 117/76   Pulse 73   Temp 98.2 F (36.8 C) (Oral)   Resp 16   Ht 5\' 4"  (1.626 m)   Wt 145 lb (65.8 kg)   SpO2 100%   BMI 24.89 kg/m   Visual Acuity Right Eye Distance:   Left Eye Distance:   Bilateral Distance:    Right Eye Near:   Left Eye Near:    Bilateral Near:     Physical Exam Vitals and nursing note reviewed.  Constitutional:      Appearance: Normal appearance. She is not ill-appearing.  HENT:     Head: Atraumatic.     Right Ear: Tympanic membrane normal.     Left Ear: Tympanic membrane normal.     Nose: Nose normal.     Mouth/Throat:     Mouth: Mucous membranes are moist.     Pharynx: Oropharynx is clear.  Eyes:     Extraocular Movements: Extraocular movements intact.     Conjunctiva/sclera: Conjunctivae normal.  Cardiovascular:     Rate and Rhythm: Normal rate and regular rhythm.     Heart sounds: Normal heart sounds.  Pulmonary:     Effort: Pulmonary effort is normal.     Breath sounds: Normal breath sounds.  Abdominal:     General: Bowel sounds are normal. There is no  distension.     Palpations: Abdomen is soft.     Tenderness: There is no abdominal tenderness.  Musculoskeletal:        General: Normal range of motion.     Cervical back: Normal range of motion and neck supple.  Skin:    General: Skin is warm and dry.  Neurological:     Mental Status: She is alert and oriented to person, place, and time.  Psychiatric:        Mood and Affect: Mood normal.        Thought Content: Thought content normal.        Judgment: Judgment normal.      UC Treatments / Results  Labs (all labs ordered are listed, but only abnormal results are displayed) Labs Reviewed  SARS CORONAVIRUS 2 (TAT 6-24 HRS)    EKG   Radiology No results found.  Procedures Procedures (including critical care time)  Medications Ordered in UC Medications - No data to display  Initial Impression / Assessment and Plan / UC Course  I have reviewed the triage vital signs and the nursing notes.  Pertinent labs & imaging results that were available during my care of the patient were reviewed by me and considered in my medical decision making (see chart for details).     Will r/o COVID 19 given sick close contacts, discussed OTC symptomatic treatment with ibuprofen, tylenol, hydration. Isolation protocol reviewed while awaiting test results. Work note given. Return precautions reviewed  Final Clinical Impressions(s) / UC Diagnoses   Final diagnoses:  Acute nonintractable headache, unspecified headache type   Discharge Instructions   None    ED Prescriptions    None     PDMP not reviewed this encounter.   Volney American, [place] 05/19/20 [phone]

## 2020-05-19 NOTE — ED Triage Notes (Signed)
Pt c/o intermittent HAx1 wk. PT states had nausea the other day, but denies any other sx.

## 2020-05-20 LAB — SARS CORONAVIRUS 2 (TAT 6-24 HRS): SARS Coronavirus 2: NEGATIVE

## 2020-08-12 ENCOUNTER — Emergency Department (HOSPITAL_COMMUNITY)
Admission: EM | Admit: 2020-08-12 | Discharge: 2020-08-12 | Disposition: A | Discharge status: Home / Self Care | Payer: Medicaid Other | Attending: Emergency Medicine | Admitting: Emergency Medicine

## 2020-08-12 ENCOUNTER — Encounter (HOSPITAL_COMMUNITY): Payer: Self-pay

## 2020-08-12 ENCOUNTER — Other Ambulatory Visit: Payer: Self-pay

## 2020-08-12 DIAGNOSIS — F17210 Nicotine dependence, cigarettes, uncomplicated: Secondary | ICD-10-CM | POA: Insufficient documentation

## 2020-08-12 DIAGNOSIS — J45909 Unspecified asthma, uncomplicated: Secondary | ICD-10-CM | POA: Insufficient documentation

## 2020-08-12 DIAGNOSIS — Y990 Civilian activity done for income or pay: Secondary | ICD-10-CM | POA: Insufficient documentation

## 2020-08-12 DIAGNOSIS — M546 Pain in thoracic spine: Secondary | ICD-10-CM | POA: Insufficient documentation

## 2020-08-12 DIAGNOSIS — X500XXA Overexertion from strenuous movement or load, initial encounter: Secondary | ICD-10-CM | POA: Insufficient documentation

## 2020-08-12 DIAGNOSIS — M542 Cervicalgia: Secondary | ICD-10-CM

## 2020-08-12 DIAGNOSIS — G8929 Other chronic pain: Secondary | ICD-10-CM

## 2020-08-12 MED ORDER — METHOCARBAMOL 500 MG PO TABS
500.0000 mg | ORAL_TABLET | Freq: Two times a day (BID) | ORAL | 0 refills | Status: DC
Start: 2020-08-12 — End: 2021-12-08

## 2020-08-12 MED ORDER — LIDOCAINE 5 % EX PTCH: 1.0000 | MEDICATED_PATCH | CUTANEOUS | 0 refills | Status: DC

## 2020-08-12 MED ORDER — PREDNISONE 10 MG (21) PO TBPK: ORAL_TABLET | ORAL | 0 refills | Status: DC

## 2020-08-12 NOTE — ED Notes (Signed)
Patient needed work note

## 2020-08-12 NOTE — Discharge Instructions (Signed)
  Expect your soreness to increase over the next 2-3 days. Take it easy, but do not lay around too much as this may make any stiffness worse.  Acetaminophen: May take acetaminophen (generic for Tylenol), as needed, for pain. Your daily total maximum amount of acetaminophen from all sources should be limited to 4000mg /day for persons without liver problems, or 2000mg /day for those with liver problems. Prednisone: Take the prednisone, as prescribed, until finished. If you are a diabetic, please know prednisone can raise your blood sugar temporarily. Methocarbamol: Methocarbamol (generic for Robaxin) is a muscle relaxer and can help relieve stiff muscles or muscle spasms.  Do not drive or perform other dangerous activities while taking this medication as it can cause drowsiness as well as changes in reaction time and judgement. Lidocaine patches: These are available via either prescription or over-the-counter. The over-the-counter option may be more economical one and are likely just as effective. There are multiple over-the-counter brands, such as Salonpas. Ice: May apply ice to the area over the next 24 hours for 15 minutes at a time to reduce pain, inflammation, and swelling, if present. Exercises: Be sure to perform the attached exercises starting with three times a week and working up to performing them daily. This is an essential part of preventing long term problems.  Follow up: Follow up with a primary care provider for any future management of these complaints. Be sure to follow up within 7-10 days. Return: Return to the ED should symptoms worsen.  For prescription assistance, may try using prescription discount sites or apps, such as goodrx.com
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
## 2020-08-12 NOTE — ED Triage Notes (Signed)
Patient arrived stating that she hurt her back a work last week. States tonight was her first night back to work and the pain increased. Used biofreeze for pain. Patient ambulatory.

## 2021-03-02 ENCOUNTER — Other Ambulatory Visit (HOSPITAL_BASED_OUTPATIENT_CLINIC_OR_DEPARTMENT_OTHER): Payer: Self-pay | Admitting: Physician Assistant

## 2021-03-25 ENCOUNTER — Ambulatory Visit: Payer: Self-pay | Admitting: Internal Medicine

## 2021-03-25 ENCOUNTER — Other Ambulatory Visit (HOSPITAL_COMMUNITY)
Admission: RE | Admit: 2021-03-25 | Discharge: 2021-03-25 | Disposition: A | Discharge status: Home / Self Care | Payer: Medicaid Other | Source: Ambulatory Visit | Attending: Internal Medicine | Admitting: Internal Medicine

## 2021-03-25 VITALS — BP 109/79 | HR 93 | Temp 98.1°F | Ht 63.0 in | Wt 140.7 lb

## 2021-03-25 DIAGNOSIS — Z23 Encounter for immunization: Secondary | ICD-10-CM

## 2021-03-25 DIAGNOSIS — M549 Dorsalgia, unspecified: Secondary | ICD-10-CM

## 2021-03-25 DIAGNOSIS — M542 Cervicalgia: Secondary | ICD-10-CM

## 2021-03-25 DIAGNOSIS — Z113 Encounter for screening for infections with a predominantly sexual mode of transmission: Secondary | ICD-10-CM | POA: Insufficient documentation

## 2021-03-25 DIAGNOSIS — Z0000 Encounter for general adult medical examination without abnormal findings: Secondary | ICD-10-CM

## 2021-03-25 DIAGNOSIS — L732 Hidradenitis suppurativa: Secondary | ICD-10-CM

## 2021-03-25 DIAGNOSIS — G8929 Other chronic pain: Secondary | ICD-10-CM

## 2021-03-25 DIAGNOSIS — M546 Pain in thoracic spine: Secondary | ICD-10-CM

## 2021-03-25 DIAGNOSIS — D219 Benign neoplasm of connective and other soft tissue, unspecified: Secondary | ICD-10-CM

## 2021-03-25 MED ORDER — ALBUTEROL SULFATE HFA 108 (90 BASE) MCG/ACT IN AERS: 2.0000 | INHALATION_SPRAY | Freq: Four times a day (QID) | RESPIRATORY_TRACT | 0 refills | Status: DC | PRN

## 2021-03-25 NOTE — Assessment & Plan Note (Signed)
Ms. Jessica Fitzpatrick requested STI/STD screening.  Also requested a referral to a gynecologist.  She states she has been sexually active with 2 sex partners in the last 3 months.  Admits to oral sex only.  Denies intercourse.  PLAN: Urine cytology ordered. HIV antibody with reflex ordered.
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
## 2021-03-25 NOTE — Assessment & Plan Note (Addendum)
Jessica Fitzpatrick has a history of hidradenitis suppurativa on her armpits that were removed via surgery.  She now has similar lesions on the pubic area.  2 papules present on the pubic area.  she states one papule is present since 1 year ago and the second papule presented several months ago.  She states that the second papule elicits pain when squeezed.    PLAN: Ms. Bernstein was referred to gynecology for further assessment.  ADDENDUM: Referred to general surgery for possible surgical treatment of hidradenitis suppurativa. Gynecology referral canceled. Not necessary.

## 2021-03-25 NOTE — Patient Instructions (Signed)
Warm wet compress in the groin area as needed.  2.  I will call you back with lab results  3.  Referral was sent for a gynecologist  4.  Referral put in for physical therapy, use over-the-counter pain creams for relief.  5.  HPV vaccine will be given at your pharmacy.

## 2021-03-26 ENCOUNTER — Encounter: Payer: Self-pay | Admitting: Internal Medicine

## 2021-03-26 LAB — URINE CYTOLOGY ANCILLARY ONLY
Chlamydia: NEGATIVE
Comment: NEGATIVE
Comment: NEGATIVE
Comment: NORMAL
Neisseria Gonorrhea: NEGATIVE
Trichomonas: NEGATIVE

## 2021-03-26 LAB — HIV ANTIBODY (ROUTINE TESTING W REFLEX): HIV Screen 4th Generation wRfx: NONREACTIVE

## 2021-03-30 ENCOUNTER — Encounter: Payer: Self-pay | Admitting: Internal Medicine

## 2021-04-02 ENCOUNTER — Other Ambulatory Visit: Payer: Self-pay | Admitting: Internal Medicine

## 2021-04-02 DIAGNOSIS — M546 Pain in thoracic spine: Secondary | ICD-10-CM

## 2021-04-02 DIAGNOSIS — G8929 Other chronic pain: Secondary | ICD-10-CM

## 2021-04-06 NOTE — Addendum Note (Signed)
Addended by: Timothy Lasso I on: 04/06/2021 11:18 AM   Modules accepted: Orders

## 2021-04-20 ENCOUNTER — Telehealth: Payer: Self-pay | Admitting: Internal Medicine

## 2021-04-20 NOTE — Telephone Encounter (Signed)
This patient is sch tomorrow 04/21/2021 for her CT.  A message has been sent by email from [facility] that reads as follows:    Maryclare Labrador To:Enedina Finner [id number] 04/20/2021 1:34 PM Start reply with: re: Fitzpatrick,Jessica  dob 12-11-1995, scheduled tomorrow for ct c spine with contrast.  unless they are looking for  metastatic disease, this order should be without contrast.   Please advise

## 2021-04-21 ENCOUNTER — Ambulatory Visit (HOSPITAL_COMMUNITY): Admission: RE | Admit: 2021-04-21 | Payer: Self-pay | Source: Ambulatory Visit

## 2021-04-21 ENCOUNTER — Other Ambulatory Visit: Payer: Self-pay | Admitting: Internal Medicine

## 2021-04-21 ENCOUNTER — Ambulatory Visit (HOSPITAL_COMMUNITY): Payer: Medicaid Other

## 2021-04-21 DIAGNOSIS — M542 Cervicalgia: Secondary | ICD-10-CM

## 2021-04-21 NOTE — Progress Notes (Signed)
Patient has a CT cervical spine and scheduled for today with contrast.  Reordering CT cervical spine without contrast.

## 2021-05-22 ENCOUNTER — Ambulatory Visit (HOSPITAL_COMMUNITY)
Admission: EM | Admit: 2021-05-22 | Discharge: 2021-05-22 | Disposition: A | Discharge status: Home / Self Care | Payer: Self-pay | Attending: Family Medicine | Admitting: Family Medicine

## 2021-05-22 ENCOUNTER — Encounter (HOSPITAL_COMMUNITY): Payer: Self-pay

## 2021-05-22 ENCOUNTER — Other Ambulatory Visit: Payer: Self-pay

## 2021-05-22 DIAGNOSIS — R112 Nausea with vomiting, unspecified: Secondary | ICD-10-CM

## 2021-05-22 MED ORDER — ONDANSETRON 4 MG PO TBDP
4.0000 mg | ORAL_TABLET | Freq: Three times a day (TID) | ORAL | 0 refills | Status: DC | PRN
Start: 2021-05-22 — End: 2021-12-08

## 2021-05-22 NOTE — ED Provider Notes (Signed)
[facility]    [name]: [id number] Arrival date & time: 05/22/21  1028      History   Chief Complaint Chief Complaint  Patient presents with   Nausea   Emesis    HPI NICK FRONHEISER is a 25 y.o. female.   Patient presenting today with 2-day history of nausea, vomiting.  Denies fever, chills, body aches, sweats, abdominal pain, dysuria, hematuria, vaginal discharge, diarrhea or constipation.  Tolerating fluids but not solids.  Has not been tried anything over-the-counter for symptoms.  No known sick contacts recently, no new medications or recent travel or diet changes.   Past Medical History:  Diagnosis Date   Asthma Jan 2013   no occurences since then   Headache(784.0)    associated with menses   Hidradenitis axillaris 04/2013    Patient Active Problem List   Diagnosis Date Noted   Back pain and neck pain 03/25/2021   Healthcare maintenance 03/25/2021   Fibroids 05/01/2018   Ovarian cyst 05/01/2018   Pelvic pain 05/01/2018   Failed vision screen 05/23/2013   Failed hearing screening 05/23/2013   Body mass index, pediatric, 85th percentile to less than 95th percentile for age 75/06/2013   Hydradenitis 05/09/2013    Past Surgical History:  Procedure Laterality Date   INCISION AND DRAINAGE ABSCESS Bilateral 05/10/2013   Procedure: INCISION AND DRAINAGE of bilateral axillary hydradenitis;  Surgeon: Leighton Ruff, MD;  Location: [facility];  Service: General;  Laterality: Bilateral;    OB History     Gravida  0   Para  0   Term  0   Preterm  0   AB  0   Living  0      SAB  0   IAB  0   Ectopic  0   Multiple  0   Live Births  0            Home Medications    Prior to Admission medications   Medication Sig Start Date End Date Taking? Authorizing Provider  ondansetron (ZOFRAN ODT) 4 MG disintegrating tablet Take 1 tablet (4 mg total) by mouth every 8 (eight) hours as needed for nausea or vomiting. 05/22/21  Yes Volney American,  PA-C  acetaminophen (TYLENOL 8 HOUR) 650 MG CR tablet Take 1 tablet (650 mg total) by mouth every 8 (eight) hours as needed. 03/22/18   Varney Biles, MD  albuterol (VENTOLIN HFA) 108 (90 Base) MCG/ACT inhaler Inhale 2 puffs into the lungs every 6 (six) hours as needed for wheezing or shortness of breath. 03/25/21   Timothy Lasso, MD  lidocaine (LIDODERM) 5 % Place 1 patch onto the skin daily. Remove & Discard patch within 12 hours or as directed by MD 08/12/20   Joy, Shawn C, PA-C  methocarbamol (ROBAXIN) 500 MG tablet Take 1 tablet (500 mg total) by mouth 2 (two) times daily. 08/12/20   Joy, Shawn C, PA-C  predniSONE (STERAPRED UNI-PAK 21 TAB) 10 MG (21) TBPK tablet Take 6 tabs ('60mg'$ ) day 1, 5 tabs ('50mg'$ ) day 2, 4 tabs ('40mg'$ ) day 3, 3 tabs ('30mg'$ ) day 4, 2 tabs ('20mg'$ ) day 5, and 1 tab ('10mg'$ ) day 6. 08/12/20   Joy, Helane Gunther, PA-C    Family History Family History  Problem Relation Age of Onset   Cancer Maternal Aunt        Breast. diagnosed young   Cancer Maternal Grandmother        Breast   Lupus Brother  Social History Social History   Tobacco Use   Smoking status: Every Day    Types: Cigarettes   Smokeless tobacco: Never   Tobacco comments:    5 cigs/d  Vaping Use   Vaping Use: Never used  Substance Use Topics   Alcohol use: Yes    Comment: occ   Drug use: Yes    Types: Marijuana     Allergies   Ibuprofen   Review of Systems Review of Systems Per HPI  Physical Exam Triage Vital Signs ED Triage Vitals  Enc Vitals Group     BP 05/22/21 1229 122/89     Pulse Rate 05/22/21 1229 82     Resp 05/22/21 1229 18     Temp 05/22/21 1229 98.1 F (36.7 C)     Temp Source 05/22/21 1229 Oral     SpO2 05/22/21 1229 100 %     Weight --      Height --      Head Circumference --      Peak Flow --      Pain Score 05/22/21 1230 0     Pain Loc --      Pain Edu? --      Excl. in [place]? --    No data found.  Updated Vital Signs BP 122/89 (BP Location: Right Arm)    Pulse 82   Temp 98.1 F (36.7 C) (Oral)   Resp 18   LMP 05/03/2021   SpO2 100%   Visual Acuity Right Eye Distance:   Left Eye Distance:   Bilateral Distance:    Right Eye Near:   Left Eye Near:    Bilateral Near:     Physical Exam Vitals and nursing note reviewed.  Constitutional:      Appearance: Normal appearance. She is not ill-appearing.  HENT:     Head: Atraumatic.     Mouth/Throat:     Mouth: Mucous membranes are moist.  Eyes:     Extraocular Movements: Extraocular movements intact.     Conjunctiva/sclera: Conjunctivae normal.  Cardiovascular:     Rate and Rhythm: Normal rate and regular rhythm.     Heart sounds: Normal heart sounds.  Pulmonary:     Effort: Pulmonary effort is normal.     Breath sounds: Normal breath sounds.  Abdominal:     General: Bowel sounds are normal. There is no distension.     Palpations: Abdomen is soft.     Tenderness: There is no abdominal tenderness. There is no right CVA tenderness, left CVA tenderness or guarding.  Musculoskeletal:        General: Normal range of motion.     Cervical back: Normal range of motion and neck supple.  Skin:    General: Skin is warm and dry.  Neurological:     Mental Status: She is alert and oriented to person, place, and time.  Psychiatric:        Mood and Affect: Mood normal.        Thought Content: Thought content normal.        Judgment: Judgment normal.     UC Treatments / Results  Labs (all labs ordered are listed, but only abnormal results are displayed) Labs Reviewed - No data to display  EKG   Radiology No results found.  Procedures Procedures (including critical care time)  Medications Ordered in UC Medications - No data to display  Initial Impression / Assessment and Plan / UC Course  I have reviewed the  triage vital signs and the nursing notes.  Pertinent labs & imaging results that were available during my care of the patient were reviewed by me and considered in my  medical decision making (see chart for details).     Exam and vital signs benign and reassuring.  We will treat with Zofran, fluids, rest.  Strict return precautions given for acutely worsening symptoms.  Final Clinical Impressions(s) / UC Diagnoses   Final diagnoses:  Intractable vomiting with nausea, unspecified vomiting type   Discharge Instructions   None    ED Prescriptions     Medication Sig Dispense Auth. Provider   ondansetron (ZOFRAN ODT) 4 MG disintegrating tablet Take 1 tablet (4 mg total) by mouth every 8 (eight) hours as needed for nausea or vomiting. 20 tablet Volney American, [place]      PDMP not reviewed this encounter.   Volney American, [place] 05/22/21 1318

## 2021-05-22 NOTE — ED Triage Notes (Signed)
Pt presents with nausea and vomiting since yesterday.

## 2021-09-01 ENCOUNTER — Ambulatory Visit (HOSPITAL_COMMUNITY)
Admission: EM | Admit: 2021-09-01 | Discharge: 2021-09-01 | Disposition: A | Discharge status: Home / Self Care | Payer: Medicaid Other | Attending: Urgent Care | Admitting: Urgent Care

## 2021-09-01 ENCOUNTER — Other Ambulatory Visit: Payer: Self-pay

## 2021-09-01 ENCOUNTER — Encounter (HOSPITAL_COMMUNITY): Payer: Self-pay

## 2021-09-01 DIAGNOSIS — R52 Pain, unspecified: Secondary | ICD-10-CM

## 2021-09-01 DIAGNOSIS — J4530 Mild persistent asthma, uncomplicated: Secondary | ICD-10-CM

## 2021-09-01 DIAGNOSIS — F17200 Nicotine dependence, unspecified, uncomplicated: Secondary | ICD-10-CM

## 2021-09-01 DIAGNOSIS — B349 Viral infection, unspecified: Secondary | ICD-10-CM

## 2021-09-01 LAB — POC INFLUENZA A AND B ANTIGEN (URGENT CARE ONLY)
INFLUENZA A ANTIGEN, POC: NEGATIVE
INFLUENZA B ANTIGEN, POC: NEGATIVE

## 2021-09-01 MED ORDER — PREDNISONE 20 MG PO TABS
ORAL_TABLET | ORAL | 0 refills | Status: DC
Start: 2021-09-01 — End: 2021-12-08

## 2021-09-01 NOTE — ED Provider Notes (Signed)
[facility]   MRN: [id number] DOB: [birth year]  Subjective:   Jessica Fitzpatrick is a 25 y.o. female presenting for 1 week history of persistent frontal headaches, chest tightness and intermittent shortness of breath, body aches.  She is a smoker, has a history of asthma.  Is not using an inhaler.  Has also had some nausea without vomiting.  No cough, chest pain, wheezing, fevers, ear pain, sinus pain.  Patient would like to be checked for COVID.  No chance of pregnancy per patient.  No current facility-administered medications for this encounter.  Current Outpatient Medications:    acetaminophen (TYLENOL 8 HOUR) 650 MG CR tablet, Take 1 tablet (650 mg total) by mouth every 8 (eight) hours as needed., Disp: 30 tablet, Rfl: 0   albuterol (VENTOLIN HFA) 108 (90 Base) MCG/ACT inhaler, Inhale 2 puffs into the lungs every 6 (six) hours as needed for wheezing or shortness of breath., Disp: 8 g, Rfl: 0   lidocaine (LIDODERM) 5 %, Place 1 patch onto the skin daily. Remove & Discard patch within 12 hours or as directed by MD, Disp: 30 patch, Rfl: 0   methocarbamol (ROBAXIN) 500 MG tablet, Take 1 tablet (500 mg total) by mouth 2 (two) times daily., Disp: 20 tablet, Rfl: 0   ondansetron (ZOFRAN ODT) 4 MG disintegrating tablet, Take 1 tablet (4 mg total) by mouth every 8 (eight) hours as needed for nausea or vomiting., Disp: 20 tablet, Rfl: 0   predniSONE (STERAPRED UNI-PAK 21 TAB) 10 MG (21) TBPK tablet, Take 6 tabs (60mg ) day 1, 5 tabs (50mg ) day 2, 4 tabs (40mg ) day 3, 3 tabs (30mg ) day 4, 2 tabs (20mg ) day 5, and 1 tab (10mg ) day 6., Disp: 21 tablet, Rfl: 0   Allergies  Allergen Reactions   Ibuprofen Hives and Itching    Past Medical History:  Diagnosis Date   Asthma Jan 2013   no occurences since then   Headache(784.0)    associated with menses   Hidradenitis axillaris 04/2013     Past Surgical History:  Procedure Laterality Date   INCISION AND DRAINAGE ABSCESS Bilateral  05/10/2013   Procedure: INCISION AND DRAINAGE of bilateral axillary hydradenitis;  Surgeon: Leighton Ruff, MD;  Location: [facility];  Service: General;  Laterality: Bilateral;    Family History  Problem Relation Age of Onset   Cancer Maternal Aunt        Breast. diagnosed young   Cancer Maternal Grandmother        Breast   Lupus Brother     Social History   Tobacco Use   Smoking status: Every Day    Types: Cigarettes   Smokeless tobacco: Never   Tobacco comments:    5 cigs/d  Vaping Use   Vaping Use: Never used  Substance Use Topics   Alcohol use: Yes    Comment: occ   Drug use: Yes    Types: Marijuana    ROS   Objective:   Vitals: BP (!) 134/93 (BP Location: Right Arm)    Pulse 77    Temp 97.9 F (36.6 C) (Oral)    Resp 18    LMP 08/10/2021    SpO2 99%   Physical Exam Constitutional:      General: She is not in acute distress.    Appearance: Normal appearance. She is well-developed. She is not ill-appearing, toxic-appearing or diaphoretic.  HENT:     Head: Normocephalic and atraumatic.     Right Ear:  Tympanic membrane and ear canal normal. No drainage or tenderness. No middle ear effusion. Tympanic membrane is not erythematous.     Left Ear: Tympanic membrane and ear canal normal. No drainage or tenderness.  No middle ear effusion. Tympanic membrane is not erythematous.     Nose: Nose normal. No congestion or rhinorrhea.     Mouth/Throat:     Mouth: Mucous membranes are moist. No oral lesions.     Pharynx: Oropharynx is clear. No pharyngeal swelling, oropharyngeal exudate, posterior oropharyngeal erythema or uvula swelling.     Tonsils: No tonsillar exudate or tonsillar abscesses.  Eyes:     Extraocular Movements: Extraocular movements intact.     Right eye: Normal extraocular motion.     Left eye: Normal extraocular motion.     Conjunctiva/sclera: Conjunctivae normal.     Pupils: Pupils are equal, round, and reactive to light.  Neck:     Meningeal:  Brudzinski's sign and Kernig's sign absent.  Cardiovascular:     Rate and Rhythm: Normal rate and regular rhythm.     Pulses: Normal pulses.     Heart sounds: Normal heart sounds. No murmur heard.   No friction rub. No gallop.  Pulmonary:     Effort: Pulmonary effort is normal. No respiratory distress.     Breath sounds: Normal breath sounds. No stridor. No wheezing, rhonchi or rales.  Musculoskeletal:     Cervical back: Normal range of motion and neck supple. No rigidity.  Lymphadenopathy:     Cervical: No cervical adenopathy.  Skin:    General: Skin is warm and dry.     Findings: No rash.  Neurological:     General: No focal deficit present.     Mental Status: She is alert and oriented to person, place, and time.  Psychiatric:        Mood and Affect: Mood normal.        Behavior: Behavior normal.        Thought Content: Thought content normal.    Results for orders placed or performed during the hospital encounter of 09/01/21 (from the past 24 hour(s))  POC Influenza A & B Ag (Urgent Care)     Status: None   Collection Time: 09/01/21  9:03 AM  Result Value Ref Range   INFLUENZA A ANTIGEN, POC NEGATIVE NEGATIVE   INFLUENZA B ANTIGEN, POC NEGATIVE NEGATIVE     Assessment and Plan :   PDMP not reviewed this encounter.  1. Acute viral syndrome   2. Mild persistent asthma without complication    Suspect an acute viral syndrome. Deferred imaging given clear cardiopulmonary exam, hemodynamically stable vital signs.  Patient is afebrile.  In light of her smoking and asthma, chest tightness and shortness of breath recommended an oral prednisone course.  Use supportive care otherwise. Counseled patient on potential for adverse effects with medications prescribed/recommended today, ER and return-to-clinic precautions discussed, patient verbalized understanding.    Jaynee Eagles, [place] 09/01/21 [phone]

## 2021-09-01 NOTE — ED Triage Notes (Signed)
Pt presents with intermittent headache X 1 week, generalized body aches X 4 days and nausea since last night.

## 2021-09-01 NOTE — Discharge Instructions (Signed)
We will manage this as a viral illness. For sore throat or cough try using a honey-based tea. Use 3 teaspoons of honey with juice squeezed from half lemon. Place shaved pieces of ginger into 1/2-1 cup of water and warm over stove top. Then mix the ingredients and repeat every 4 hours as needed. Please take Tylenol 500mg -650mg  every 6 hours for throat pain, fevers, aches and pains. Hydrate very well with at least 2 liters of water. Eat light meals such as soups (chicken and noodles, vegetable, chicken and wild rice).  Do not eat foods that you are allergic to.  Taking an antihistamine like Zyrtec can help against postnasal drainage, sinus congestion.  You can take this together with the prednisone.

## 2021-12-08 ENCOUNTER — Ambulatory Visit (HOSPITAL_COMMUNITY)
Admission: EM | Admit: 2021-12-08 | Discharge: 2021-12-08 | Disposition: A | Discharge status: Home / Self Care | Payer: Medicaid Other | Attending: Family Medicine | Admitting: Family Medicine

## 2021-12-08 ENCOUNTER — Encounter (HOSPITAL_COMMUNITY): Payer: Self-pay

## 2021-12-08 DIAGNOSIS — K529 Noninfective gastroenteritis and colitis, unspecified: Secondary | ICD-10-CM

## 2021-12-08 MED ORDER — DICYCLOMINE HCL 20 MG PO TABS: 20.0000 mg | ORAL_TABLET | Freq: Four times a day (QID) | ORAL | 0 refills | Status: DC | PRN

## 2021-12-08 MED ORDER — ONDANSETRON 4 MG PO TBDP: 4.0000 mg | ORAL_TABLET | Freq: Three times a day (TID) | ORAL | 0 refills | Status: DC | PRN

## 2021-12-08 NOTE — Discharge Instructions (Addendum)
Ondansetron dissolved in the mouth every 8 hours as needed for nausea or vomiting. ?Clear liquids and bland things to eat.  ? ?Dicyclomine 20 mg--6 take 1 tablet every 6 hours as needed for abdominal or intestinal cramps ? ?You can also take Tylenol 500 mg 2 every 4 hours as needed for pain or fever ?

## 2021-12-08 NOTE — ED Provider Notes (Signed)
?[facility] ? ? ? ?[name]: [id number] ?Arrival date & time: 12/08/21  0845 ? ? ?  ? ?History   ?Chief Complaint ?Chief Complaint  ?Patient presents with  ? Diarrhea  ? ? ?HPI ?Jessica Fitzpatrick is a 26 y.o. female.  ? ? ?Diarrhea ?Here for diarrhea and stomach cramps that began early in the morning of March 27.  She has now not had a bowel movement in about 6 hours.  It was very frequent before that.  No blood in the stool and no fever noted.  She has had some chills and some aches during this time.  No vomiting but she has had nausea.  The cramps come and go maybe lasting 5 minutes at a time sometimes they are generalized sometimes they are in her upper abdomen ? ?No dysuria and no hematuria ? ?Last menstrual period was about a month ago ? ?Past Medical History:  ?Diagnosis Date  ? Asthma Jan 2013  ? no occurences since then  ? Headache(784.0)   ? associated with menses  ? Hidradenitis axillaris 04/2013  ? ? ?Patient Active Problem List  ? Diagnosis Date Noted  ? Back pain and neck pain 03/25/2021  ? Healthcare maintenance 03/25/2021  ? Fibroids 05/01/2018  ? Ovarian cyst 05/01/2018  ? Pelvic pain 05/01/2018  ? Failed vision screen 05/23/2013  ? Failed hearing screening 05/23/2013  ? Body mass index, pediatric, 85th percentile to less than 95th percentile for age 13/06/2013  ? Hydradenitis 05/09/2013  ? ? ?Past Surgical History:  ?Procedure Laterality Date  ? INCISION AND DRAINAGE ABSCESS Bilateral 05/10/2013  ? Procedure: INCISION AND DRAINAGE of bilateral axillary hydradenitis;  Surgeon: Leighton Ruff, MD;  Location: [facility];  Service: General;  Laterality: Bilateral;  ? ? ?OB History   ? ? Gravida  ?0  ? Para  ?0  ? Term  ?0  ? Preterm  ?0  ? AB  ?0  ? Living  ?0  ?  ? ? SAB  ?0  ? IAB  ?0  ? Ectopic  ?0  ? Multiple  ?0  ? Live Births  ?0  ?   ?  ?  ? ? ? ?Home Medications   ? ?Prior to Admission medications   ?Medication Sig Start Date End Date Taking? Authorizing Provider  ?dicyclomine (BENTYL) 20 MG  tablet Take 1 tablet (20 mg total) by mouth every 6 (six) hours as needed (intestinal cramps). 12/08/21  Yes Barrett Henle, MD  ?ondansetron (ZOFRAN-ODT) 4 MG disintegrating tablet Take 1 tablet (4 mg total) by mouth every 8 (eight) hours as needed for nausea or vomiting. 12/08/21  Yes Barrett Henle, MD  ?acetaminophen (TYLENOL 8 HOUR) 650 MG CR tablet Take 1 tablet (650 mg total) by mouth every 8 (eight) hours as needed. 03/22/18   Varney Biles, MD  ?albuterol (VENTOLIN HFA) 108 (90 Base) MCG/ACT inhaler Inhale 2 puffs into the lungs every 6 (six) hours as needed for wheezing or shortness of breath. 03/25/21   Timothy Lasso, MD  ? ? ?Family History ?Family History  ?Problem Relation Age of Onset  ? Cancer Maternal Aunt   ?     Breast. diagnosed young  ? Cancer Maternal Grandmother   ?     Breast  ? Lupus Brother   ? ? ?Social History ?Social History  ? ?Tobacco Use  ? Smoking status: Every Day  ?  Types: Cigarettes  ? Smokeless tobacco: Never  ? Tobacco comments:  ?  5 cigs/d  ?Vaping Use  ? Vaping Use: Never used  ?Substance Use Topics  ? Alcohol use: Yes  ?  Comment: occ  ? Drug use: Yes  ?  Types: Marijuana  ? ? ? ?Allergies   ?Ibuprofen ? ? ?Review of Systems ?Review of Systems  ?Gastrointestinal:  Positive for diarrhea.  ? ? ?Physical Exam ?Triage Vital Signs ?ED Triage Vitals  ?Enc Vitals Group  ?   BP 12/08/21 0924 115/80  ?   Pulse Rate 12/08/21 0924 79  ?   Resp 12/08/21 0924 18  ?   Temp 12/08/21 0924 98.9 ?F (37.2 ?C)  ?   Temp Source 12/08/21 0924 Oral  ?   SpO2 12/08/21 0924 99 %  ?   Weight --   ?   Height --   ?   Head Circumference --   ?   Peak Flow --   ?   Pain Score 12/08/21 0923 10  ?   Pain Loc --   ?   Pain Edu? --   ?   Excl. in [place]? --   ? ?No data found. ? ?Updated Vital Signs ?BP 115/80 (BP Location: Right Arm)   Pulse 79   Temp 98.9 ?F (37.2 ?C) (Oral)   Resp 18   LMP 11/10/2021   SpO2 99%  ? ?Visual Acuity ?Right Eye Distance:   ?Left Eye Distance:   ?Bilateral  Distance:   ? ?Right Eye Near:   ?Left Eye Near:    ?Bilateral Near:    ? ?Physical Exam ?Vitals and nursing note reviewed.  ?Constitutional:   ?   General: She is not in acute distress. ?   Appearance: She is not ill-appearing, toxic-appearing or diaphoretic.  ?   Comments: Occ winces with stomach cramps while I am in the room with her.  ?HENT:  ?   Mouth/Throat:  ?   Mouth: Mucous membranes are moist.  ?   Pharynx: No oropharyngeal exudate or posterior oropharyngeal erythema.  ?Eyes:  ?   Extraocular Movements: Extraocular movements intact.  ?   Pupils: Pupils are equal, round, and reactive to light.  ?Cardiovascular:  ?   Rate and Rhythm: Normal rate and regular rhythm.  ?   Heart sounds: No murmur heard. ?Pulmonary:  ?   Effort: Pulmonary effort is normal.  ?   Breath sounds: Normal breath sounds.  ?Abdominal:  ?   General: Bowel sounds are normal. There is no distension.  ?   Palpations: Abdomen is soft. There is no mass.  ?   Tenderness: There is abdominal tenderness (mild generalized). There is no guarding.  ?Musculoskeletal:  ?   Cervical back: Neck supple.  ?Lymphadenopathy:  ?   Cervical: No cervical adenopathy.  ?Skin: ?   Capillary Refill: Capillary refill takes less than 2 seconds.  ?   Coloration: Skin is not jaundiced or pale.  ?Neurological:  ?   General: No focal deficit present.  ?   Mental Status: She is alert and oriented to person, place, and time.  ?Psychiatric:     ?   Behavior: Behavior normal.  ? ? ? ?UC Treatments / Results  ?Labs ?(all labs ordered are listed, but only abnormal results are displayed) ?Labs Reviewed - No data to display ? ?EKG ? ? ?Radiology ?No results found. ? ?Procedures ?Procedures (including critical care time) ? ?Medications Ordered in UC ?Medications - No data to display ? ?Initial Impression / Assessment and Plan / UC Course  ?  I have reviewed the triage vital signs and the nursing notes. ? ?Pertinent labs & imaging results that were available during my care of the  patient were reviewed by me and considered in my medical decision making (see chart for details). ? ?  ? ?We will treat the stomach cramps and nausea.  Warnings given for the ER if she has persistent vomiting or the abdominal pain worsens or becomes more persistent ?Final Clinical Impressions(s) / UC Diagnoses  ? ?Final diagnoses:  ?Gastroenteritis  ? ? ? ?Discharge Instructions   ? ?  ?Ondansetron dissolved in the mouth every 8 hours as needed for nausea or vomiting. ?Clear liquids and bland things to eat.  ? ?Dicyclomine 20 mg--6 take 1 tablet every 6 hours as needed for abdominal or intestinal cramps ? ?You can also take Tylenol 500 mg 2 every 4 hours as needed for pain or fever ? ? ? ? ?ED Prescriptions   ? ? Medication Sig Dispense Auth. Provider  ? ondansetron (ZOFRAN-ODT) 4 MG disintegrating tablet Take 1 tablet (4 mg total) by mouth every 8 (eight) hours as needed for nausea or vomiting. 10 tablet Barrett Henle, MD  ? dicyclomine (BENTYL) 20 MG tablet Take 1 tablet (20 mg total) by mouth every 6 (six) hours as needed (intestinal cramps). 30 tablet Barrett Henle, MD  ? ?  ? ?PDMP not reviewed this encounter. ?  ?Barrett Henle, MD ?12/08/21 [phone] ? ?

## 2021-12-08 NOTE — ED Triage Notes (Signed)
Pt presents today with diarrhea, body aches, vomiting, and chills. Pt stated she took medication yesterday for nausea. ?

## 2022-03-03 ENCOUNTER — Ambulatory Visit (HOSPITAL_COMMUNITY)
Admission: EM | Admit: 2022-03-03 | Discharge: 2022-03-03 | Disposition: A | Discharge status: Home / Self Care | Payer: Medicaid Other | Attending: Emergency Medicine | Admitting: Emergency Medicine

## 2022-03-03 ENCOUNTER — Other Ambulatory Visit: Payer: Self-pay

## 2022-03-03 ENCOUNTER — Encounter (HOSPITAL_COMMUNITY): Payer: Self-pay | Admitting: Emergency Medicine

## 2022-03-03 DIAGNOSIS — R112 Nausea with vomiting, unspecified: Secondary | ICD-10-CM

## 2022-03-03 LAB — POCT URINALYSIS DIPSTICK, ED / UC
Bilirubin Urine: NEGATIVE
Glucose, UA: NEGATIVE mg/dL
Ketones, ur: NEGATIVE mg/dL
Leukocytes,Ua: NEGATIVE
Nitrite: NEGATIVE
Protein, ur: NEGATIVE mg/dL
Specific Gravity, Urine: 1.02 (ref 1.005–1.030)
Urobilinogen, UA: 0.2 mg/dL (ref 0.0–1.0)
pH: 7 (ref 5.0–8.0)

## 2022-03-03 LAB — POC URINE PREG, ED: Preg Test, Ur: NEGATIVE

## 2022-03-03 MED ORDER — ONDANSETRON 4 MG PO TBDP
4.0000 mg | ORAL_TABLET | Freq: Once | ORAL | Status: AC
  Administered 2022-03-03: 4 mg via ORAL

## 2022-03-03 MED ORDER — ONDANSETRON 4 MG PO TBDP: 4.0000 mg | ORAL_TABLET | Freq: Four times a day (QID) | ORAL | 0 refills | Status: DC

## 2022-03-03 MED ORDER — ONDANSETRON 4 MG PO TBDP
ORAL_TABLET | ORAL | Status: AC
  Filled 2022-03-03: qty 1

## 2022-03-03 NOTE — ED Triage Notes (Signed)
Patient c/o generalized ABD pain and emesis that started this morning.   Patient denies fever.   Patient endorses having a "normal" bowel movement this morning.   Patient endorses having 5 episodes of emesis this morning.   Patient states " I did have alcohol and pizza last night, I had some about 5 shots of tequila, but I never had this problem".   Patient hasn't taken any medications for symptoms.

## 2022-03-03 NOTE — ED Provider Notes (Signed)
[facility]    [name]: [id number] Arrival date & time: 03/03/22  6283     History   Chief Complaint Chief Complaint  Patient presents with   Emesis   Abdominal Pain    HPI Jessica Fitzpatrick is a 26 y.o. female.  Presents with nausea and vomiting that began early this morning.  Reports 5 tequila shots and pizza last night, woke up with burning sensation in the belly.  5 episodes of emesis.  Has not thrown up in clinic. Denies blood in the vomit, severe abdominal pain, constipation or diarrhea, fever or chills.  Denies vaginal bleeding, spotting, discharge, odor, rash.  No urinary symptoms.  Regular bowel movements. Occasional marijuana use.  Past Medical History:  Diagnosis Date   Asthma Jan 2013   no occurences since then   Headache(784.0)    associated with menses   Hidradenitis axillaris 04/2013    Patient Active Problem List   Diagnosis Date Noted   Back pain and neck pain 03/25/2021   Healthcare maintenance 03/25/2021   Fibroids 05/01/2018   Ovarian cyst 05/01/2018   Pelvic pain 05/01/2018   Failed vision screen 05/23/2013   Failed hearing screening 05/23/2013   Body mass index, pediatric, 85th percentile to less than 95th percentile for age 21/06/2013   Hydradenitis 05/09/2013    Past Surgical History:  Procedure Laterality Date   INCISION AND DRAINAGE ABSCESS Bilateral 05/10/2013   Procedure: INCISION AND DRAINAGE of bilateral axillary hydradenitis;  Surgeon: Leighton Ruff, MD;  Location: [facility];  Service: General;  Laterality: Bilateral;    OB History     Gravida  0   Para  0   Term  0   Preterm  0   AB  0   Living  0      SAB  0   IAB  0   Ectopic  0   Multiple  0   Live Births  0            Home Medications    Prior to Admission medications   Medication Sig Start Date End Date Taking? Authorizing Provider  albuterol (VENTOLIN HFA) 108 (90 Base) MCG/ACT inhaler Inhale 2 puffs into the lungs every 6 (six) hours as  needed for wheezing or shortness of breath. 03/25/21   Timothy Lasso, MD  ondansetron (ZOFRAN-ODT) 4 MG disintegrating tablet Take 1 tablet (4 mg total) by mouth every 6 (six) hours. 03/03/22   Garrison Michie, Wells Guiles, PA-C    Family History Family History  Problem Relation Age of Onset   Cancer Maternal Aunt        Breast. diagnosed young   Cancer Maternal Grandmother        Breast   Lupus Brother     Social History Social History   Tobacco Use   Smoking status: Every Day    Types: Cigarettes   Smokeless tobacco: Never   Tobacco comments:    5 cigs/d  Vaping Use   Vaping Use: Never used  Substance Use Topics   Alcohol use: Yes    Alcohol/week: 5.0 standard drinks of alcohol    Types: 5 Shots of liquor per week   Drug use: Yes    Frequency: 3.0 times per week    Types: Marijuana     Allergies   Ibuprofen   Review of Systems Review of Systems  Gastrointestinal:  Positive for abdominal pain and vomiting.   Per HPI  Physical Exam Triage Vital Signs ED Triage Vitals  Enc Vitals Group     BP 03/03/22 0953 118/80     Pulse Rate 03/03/22 0953 76     Resp 03/03/22 0953 18     Temp 03/03/22 0953 97.9 F (36.6 C)     Temp Source 03/03/22 0953 Oral     SpO2 03/03/22 0953 99 %     Weight --      Height --      Head Circumference --      Peak Flow --      Pain Score 03/03/22 0957 8     Pain Loc --      Pain Edu? --      Excl. in [place]? --    No data found.  Updated Vital Signs BP 118/80 (BP Location: Right Arm)   Pulse 76   Temp 97.9 F (36.6 C) (Oral)   Resp 18   LMP 02/17/2022 (Approximate)   SpO2 99%    Physical Exam Vitals and nursing note reviewed.  Constitutional:      Appearance: Normal appearance.  HENT:     Mouth/Throat:     Mouth: Mucous membranes are moist.     Pharynx: Oropharynx is clear.  Eyes:     Conjunctiva/sclera: Conjunctivae normal.  Cardiovascular:     Rate and Rhythm: Normal rate and regular rhythm.     Heart sounds: Normal heart  sounds.  Pulmonary:     Effort: Pulmonary effort is normal. No respiratory distress.     Breath sounds: Normal breath sounds.  Abdominal:     General: Bowel sounds are normal.     Palpations: Abdomen is soft.     Tenderness: There is abdominal tenderness in the epigastric area. There is no right CVA tenderness, left CVA tenderness, guarding or rebound.     Comments: Mild epigastric tenderness to palpation  Musculoskeletal:        General: Normal range of motion.  Skin:    General: Skin is warm and dry.  Neurological:     Mental Status: She is alert and oriented to person, place, and time.      UC Treatments / Results  Labs (all labs ordered are listed, but only abnormal results are displayed) Labs Reviewed  POCT URINALYSIS DIPSTICK, ED / UC - Abnormal; Notable for the following components:      Result Value   Hgb urine dipstick TRACE (*)    All other components within normal limits  POC URINE PREG, ED    EKG   Radiology No results found.  Procedures Procedures (including critical care time)  Medications Ordered in UC Medications  ondansetron (ZOFRAN-ODT) disintegrating tablet 4 mg (4 mg Oral Given 03/03/22 1008)    Initial Impression / Assessment and Plan / UC Course  I have reviewed the triage vital signs and the nursing notes.  Pertinent labs & imaging results that were available during my care of the patient were reviewed by me and considered in my medical decision making (see chart for details).  Patient is much improved after dose of Zofran.  Urine pregnancy negative, urinalysis only with trace hemoglobin.  At this time I believe emesis may be due to alcohol consumption the night before. Abdominal discomfort is epigastric and feels like burning sensation after vomiting. No concern for acute abdomen at this time. Discussed avoiding alcohol and marijuana as these can cause upset stomach and vomiting.  Recommend bland diet for the next day or 2.  Zofran sent in case  nausea returns. Return precautions  discussed. Patient agrees to plan and is discharged in stable condition.  Final Clinical Impressions(s) / UC Diagnoses   Final diagnoses:  Nausea and vomiting, unspecified vomiting type     Discharge Instructions      Take the nausea medicine as needed every 6 hours. I recommend trying a bland diet for the rest of the day.  Avoid alcohol and marijuana use as these will upset the stomach.  If you have worsening symptoms please go to the emergency department.    ED Prescriptions     Medication Sig Dispense Auth. Provider   ondansetron (ZOFRAN-ODT) 4 MG disintegrating tablet Take 1 tablet (4 mg total) by mouth every 6 (six) hours. 12 tablet Wednesday Ericsson, Wells Guiles, PA-C      PDMP not reviewed this encounter.   Mya Suell, Vernice Jefferson 03/03/22 1106

## 2022-03-03 NOTE — Discharge Instructions (Addendum)
Take the nausea medicine as needed every 6 hours. I recommend trying a bland diet for the rest of the day.  Avoid alcohol and marijuana use as these will upset the stomach.  If you have worsening symptoms please go to the emergency department.

## 2022-04-29 ENCOUNTER — Ambulatory Visit (HOSPITAL_COMMUNITY)
Admission: EM | Admit: 2022-04-29 | Discharge: 2022-04-29 | Disposition: A | Discharge status: Home / Self Care | Payer: Medicaid Other | Attending: Physician Assistant | Admitting: Physician Assistant

## 2022-04-29 ENCOUNTER — Encounter (HOSPITAL_COMMUNITY): Payer: Self-pay

## 2022-04-29 DIAGNOSIS — J069 Acute upper respiratory infection, unspecified: Secondary | ICD-10-CM

## 2022-04-29 DIAGNOSIS — U071 COVID-19: Secondary | ICD-10-CM | POA: Insufficient documentation

## 2022-04-29 DIAGNOSIS — Z3202 Encounter for pregnancy test, result negative: Secondary | ICD-10-CM

## 2022-04-29 DIAGNOSIS — J4521 Mild intermittent asthma with (acute) exacerbation: Secondary | ICD-10-CM

## 2022-04-29 LAB — SARS CORONAVIRUS 2 (TAT 6-24 HRS): SARS Coronavirus 2: POSITIVE — AB

## 2022-04-29 LAB — POC URINE PREG, ED: Preg Test, Ur: NEGATIVE

## 2022-04-29 MED ORDER — METHYLPREDNISOLONE SODIUM SUCC 125 MG IJ SOLR
80.0000 mg | Freq: Once | INTRAMUSCULAR | Status: AC
  Administered 2022-04-29: 80 mg via INTRAMUSCULAR

## 2022-04-29 MED ORDER — IPRATROPIUM-ALBUTEROL 0.5-2.5 (3) MG/3ML IN SOLN
3.0000 mL | Freq: Once | RESPIRATORY_TRACT | Status: AC
  Administered 2022-04-29: 3 mL via RESPIRATORY_TRACT

## 2022-04-29 MED ORDER — PREDNISONE 10 MG (21) PO TBPK: ORAL_TABLET | ORAL | 0 refills | Status: DC

## 2022-04-29 MED ORDER — METHYLPREDNISOLONE SODIUM SUCC 125 MG IJ SOLR
INTRAMUSCULAR | Status: AC
  Filled 2022-04-29: qty 2

## 2022-04-29 MED ORDER — ALBUTEROL SULFATE HFA 108 (90 BASE) MCG/ACT IN AERS
INHALATION_SPRAY | RESPIRATORY_TRACT | Status: AC
  Filled 2022-04-29: qty 6.7

## 2022-04-29 MED ORDER — IPRATROPIUM-ALBUTEROL 0.5-2.5 (3) MG/3ML IN SOLN
RESPIRATORY_TRACT | Status: AC
  Filled 2022-04-29: qty 3

## 2022-04-29 MED ORDER — ALBUTEROL SULFATE HFA 108 (90 BASE) MCG/ACT IN AERS
2.0000 | INHALATION_SPRAY | Freq: Once | RESPIRATORY_TRACT | Status: AC
  Administered 2022-04-29: 2 via RESPIRATORY_TRACT

## 2022-04-29 NOTE — ED Provider Notes (Signed)
[name]    [name]: [id number] Arrival date & time: 04/29/22  0801      History   Chief Complaint Chief Complaint  Patient presents with   Generalized Body Aches   Cough   Nasal Congestion    HPI Jessica Fitzpatrick is a 26 y.o. female.   Patient presents today with a 2-day history of URI symptoms.  Reports cough, body aches, fatigue, malaise, nasal congestion, shortness of breath.  Denies any chest pain, fever, nausea, vomiting, diarrhea.  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  Has had COVID several years ago.  Has had COVID-vaccine.  Does have a history of asthma and has been using albuterol inhaler more frequently since symptom onset with only temporary relief of symptoms.  Does not take any maintenance medication for asthma.  Denies previous hospitalization or intubation related to asthma exacerbation.  Denies any recent antibiotics or steroid use.  Has not tried any over-the-counter medication for symptom management.  Is having a difficult time with daily activities including work duties as result of symptoms.    Past Medical History:  Diagnosis Date   Asthma Jan 2013   no occurences since then   Headache(784.0)    associated with menses   Hidradenitis axillaris 04/2013    Patient Active Problem List   Diagnosis Date Noted   Back pain and neck pain 03/25/2021   Healthcare maintenance 03/25/2021   Fibroids 05/01/2018   Ovarian cyst 05/01/2018   Pelvic pain 05/01/2018   Failed vision screen 05/23/2013   Failed hearing screening 05/23/2013   Body mass index, pediatric, 85th percentile to less than 95th percentile for age 69/06/2013   Hydradenitis 05/09/2013    Past Surgical History:  Procedure Laterality Date   INCISION AND DRAINAGE ABSCESS Bilateral 05/10/2013   Procedure: INCISION AND DRAINAGE of bilateral axillary hydradenitis;  Surgeon: Leighton Ruff, MD;  Location: [facility];  Service: General;  Laterality:  Bilateral;    OB History     Gravida  0   Para  0   Term  0   Preterm  0   AB  0   Living  0      SAB  0   IAB  0   Ectopic  0   Multiple  0   Live Births  0            Home Medications    Prior to Admission medications   Medication Sig Start Date End Date Taking? Authorizing Provider  predniSONE (STERAPRED UNI-PAK 21 TAB) 10 MG (21) TBPK tablet As directed 04/29/22  Yes Usbaldo Pannone K, PA-C  albuterol (VENTOLIN HFA) 108 (90 Base) MCG/ACT inhaler Inhale 2 puffs into the lungs every 6 (six) hours as needed for wheezing or shortness of breath. 03/25/21   Timothy Lasso, MD  ondansetron (ZOFRAN-ODT) 4 MG disintegrating tablet Take 1 tablet (4 mg total) by mouth every 6 (six) hours. 03/03/22   Rising, Wells Guiles, PA-C    Family History Family History  Problem Relation Age of Onset   Cancer Maternal Aunt        Breast. diagnosed young   Cancer Maternal Grandmother        Breast   Lupus Brother     Social History Social History   Tobacco Use   Smoking status: Every Day    Types: Cigarettes   Smokeless tobacco: Never   Tobacco comments:    5 cigs/d  Vaping  Use   Vaping Use: Never used  Substance Use Topics   Alcohol use: Yes    Alcohol/week: 5.0 standard drinks of alcohol    Types: 5 Shots of liquor per week   Drug use: Yes    Frequency: 3.0 times per week    Types: Marijuana     Allergies   Ibuprofen   Review of Systems Review of Systems  Constitutional:  Positive for activity change and fatigue. Negative for appetite change and fever.  HENT:  Positive for congestion and sinus pressure. Negative for sneezing and sore throat.   Respiratory:  Positive for cough, chest tightness and shortness of breath.   Cardiovascular:  Negative for chest pain.  Gastrointestinal:  Negative for abdominal pain, diarrhea, nausea and vomiting.  Musculoskeletal:  Positive for arthralgias and myalgias.  Neurological:  Positive for headaches. Negative for dizziness  and light-headedness.     Physical Exam Triage Vital Signs ED Triage Vitals  Enc Vitals Group     BP 04/29/22 0813 114/79     Pulse Rate 04/29/22 0815 78     Resp 04/29/22 0813 18     Temp 04/29/22 0815 98.1 F (36.7 C)     Temp Source 04/29/22 0815 Oral     SpO2 04/29/22 0813 100 %     Weight --      Height --      Head Circumference --      Peak Flow --      Pain Score --      Pain Loc --      Pain Edu? --      Excl. in [place]? --    No data found.  Updated Vital Signs BP 114/79 (BP Location: Left Arm)   Pulse 78   Temp 98.1 F (36.7 C) (Oral)   Resp 18   SpO2 100%   Visual Acuity Right Eye Distance:   Left Eye Distance:   Bilateral Distance:    Right Eye Near:   Left Eye Near:    Bilateral Near:     Physical Exam Vitals reviewed.  Constitutional:      General: She is awake. She is not in acute distress.    Appearance: Normal appearance. She is well-developed. She is not ill-appearing.     Comments: Very pleasant female appears stated age in no acute distress sitting comfortably in exam room wrapped in a blanket  HENT:     Head: Normocephalic and atraumatic.     Right Ear: Tympanic membrane, ear canal and external ear normal. Tympanic membrane is not erythematous or bulging.     Left Ear: Tympanic membrane, ear canal and external ear normal. Tympanic membrane is not erythematous or bulging.     Nose:     Right Sinus: Maxillary sinus tenderness present. No frontal sinus tenderness.     Left Sinus: Maxillary sinus tenderness present. No frontal sinus tenderness.     Mouth/Throat:     Pharynx: Uvula midline. No oropharyngeal exudate or posterior oropharyngeal erythema.  Cardiovascular:     Rate and Rhythm: Normal rate and regular rhythm.     Heart sounds: Normal heart sounds, S1 normal and S2 normal. No murmur heard. Pulmonary:     Effort: Pulmonary effort is normal.     Breath sounds: Wheezing present. No rhonchi or rales.     Comments: Widespread wheezing  with reactive cough with deep breathing Psychiatric:        Behavior: Behavior is cooperative.  UC Treatments / Results  Labs (all labs ordered are listed, but only abnormal results are displayed) Labs Reviewed  SARS CORONAVIRUS 2 (TAT 6-24 HRS)  POC URINE PREG, ED    EKG   Radiology No results found.  Procedures Procedures (including critical care time)  Medications Ordered in UC Medications  ipratropium-albuterol (DUONEB) 0.5-2.5 (3) MG/3ML nebulizer solution 3 mL (3 mLs Nebulization Given 04/29/22 0840)  methylPREDNISolone sodium succinate (SOLU-MEDROL) 125 mg/2 mL injection 80 mg (80 mg Intramuscular Given 04/29/22 0849)  albuterol (VENTOLIN HFA) 108 (90 Base) MCG/ACT inhaler 2 puff (2 puffs Inhalation Given 04/29/22 0924)    Initial Impression / Assessment and Plan / UC Course  I have reviewed the triage vital signs and the nursing notes.  Pertinent labs & imaging results that were available during my care of the patient were reviewed by me and considered in my medical decision making (see chart for details).     Patient is well-appearing, afebrile, nontoxic, nontachycardic.  She has improvement in symptoms with Solu-Medrol in clinic and DuoNeb.  Discussed that URI has likely led to asthma exacerbation.  She was started on prednisone taper with instruction to take NSAIDs with this medication due to risk of GI bleeding.  Given clinical presentation concern for COVID.  COVID testing was obtained and is pending.  Given history of asthma she is a candidate for antiviral therapy.  We do not have a recent kidney function in EMR so we attempted to obtain BMP but were unsuccessful.  Discussed that as result we cannot use Paxlovid if she is positive for COVID.  She reports that she has no concern for pregnancy and would not be pregnant within the next 6 months to 1 year.  Urine pregnancy was negative in clinic.  If she has positive we will prescribe molnupiravir.  She was given  albuterol inhaler to take home per her request.  She was encouraged to use over-the-counter medications for additional symptom relief.  She is to rest and drink plenty of fluid.  Work excuse note with current CDC return to work guidelines provided.  Discussed that if she has any worsening symptoms including increased pain, shortness of breath, nausea, vomiting, weakness, chest pain she needs to go to the emergency room immediately to which she expressed understanding.  Final Clinical Impressions(s) / UC Diagnoses   Final diagnoses:  Upper respiratory tract infection, unspecified type  Mild intermittent asthma with acute exacerbation     Discharge Instructions      I believe that the virus you have has flared her asthma.  We gave an injection of steroids today.  Continue using your albuterol inhaler every 4-6 hours as needed.  Start prednisone tomorrow (04/30/2022).  Do not take NSAIDs including aspirin, ibuprofen/Advil, naproxen/Aleve with this medication as it can cause stomach bleeding.  You can use Tylenol, Mucinex, Flonase.  If you are positive for COVID we will contact you and call in [place].  Make sure you rest and drink plenty fluid.  If you have any worsening symptoms including shortness of breath, high fever, chest pain, nausea, vomiting, weakness you need to go to the emergency room.     ED Prescriptions     Medication Sig Dispense Auth. Provider   predniSONE (STERAPRED UNI-PAK 21 TAB) 10 MG (21) TBPK tablet As directed 21 tablet Kaikoa Magro K, PA-C      PDMP not reviewed this encounter.   Terrilee Croak, PA-C 04/29/22 [id number]

## 2022-04-29 NOTE — Discharge Instructions (Addendum)
I believe that the virus you have has flared her asthma.  We gave an injection of steroids today.  Continue using your albuterol inhaler every 4-6 hours as needed.  Start prednisone tomorrow (04/30/2022).  Do not take NSAIDs including aspirin, ibuprofen/Advil, naproxen/Aleve with this medication as it can cause stomach bleeding.  You can use Tylenol, Mucinex, Flonase.  If you are positive for COVID we will contact you and call in [place].  Make sure you rest and drink plenty fluid.  If you have any worsening symptoms including shortness of breath, high fever, chest pain, nausea, vomiting, weakness you need to go to the emergency room.

## 2022-04-29 NOTE — ED Triage Notes (Signed)
Pt reports body aches x 2 days. Pt reports cough and sob x 2 days. Pt reports body ache x 2 days.  She is using her albuterol inhaler.

## 2022-04-30 ENCOUNTER — Telehealth (HOSPITAL_COMMUNITY): Payer: Self-pay | Admitting: Emergency Medicine

## 2022-04-30 MED ORDER — MOLNUPIRAVIR EUA 200MG CAPSULE: 4.0000 | ORAL_CAPSULE | Freq: Two times a day (BID) | ORAL | 0 refills | Status: AC

## 2022-09-13 ENCOUNTER — Encounter (HOSPITAL_COMMUNITY): Payer: Self-pay

## 2022-09-13 ENCOUNTER — Ambulatory Visit (HOSPITAL_COMMUNITY)
Admission: EM | Admit: 2022-09-13 | Discharge: 2022-09-13 | Disposition: A | Discharge status: Home / Self Care | Payer: Medicaid Other | Attending: Nurse Practitioner | Admitting: Nurse Practitioner

## 2022-09-13 DIAGNOSIS — J4521 Mild intermittent asthma with (acute) exacerbation: Secondary | ICD-10-CM | POA: Diagnosis not present

## 2022-09-13 DIAGNOSIS — B349 Viral infection, unspecified: Secondary | ICD-10-CM

## 2022-09-13 DIAGNOSIS — R52 Pain, unspecified: Secondary | ICD-10-CM

## 2022-09-13 LAB — POC INFLUENZA A AND B ANTIGEN (URGENT CARE ONLY)
INFLUENZA A ANTIGEN, POC: NEGATIVE
INFLUENZA B ANTIGEN, POC: NEGATIVE

## 2022-09-13 MED ORDER — IPRATROPIUM-ALBUTEROL 0.5-2.5 (3) MG/3ML IN SOLN
RESPIRATORY_TRACT | Status: AC
  Filled 2022-09-13: qty 3

## 2022-09-13 MED ORDER — BENZONATATE 200 MG PO CAPS: 200.0000 mg | ORAL_CAPSULE | Freq: Three times a day (TID) | ORAL | 0 refills | Status: DC | PRN

## 2022-09-13 MED ORDER — IPRATROPIUM-ALBUTEROL 0.5-2.5 (3) MG/3ML IN SOLN
3.0000 mL | Freq: Once | RESPIRATORY_TRACT | Status: AC
  Administered 2022-09-13: 3 mL via RESPIRATORY_TRACT

## 2022-09-13 MED ORDER — FLUTICASONE PROPIONATE HFA 110 MCG/ACT IN AERO: 1.0000 | INHALATION_SPRAY | Freq: Two times a day (BID) | RESPIRATORY_TRACT | 12 refills | Status: DC

## 2022-09-13 NOTE — ED Provider Notes (Signed)
[facility]    [name]: [id number] Arrival date & time: 09/13/22  8588      History   Chief Complaint Chief Complaint  Patient presents with   Cough   Shortness of Breath   Headache    HPI Jessica Fitzpatrick is a 27 y.o. female presents for evaluation of URI symptoms for 2 days. Patient reports associated symptoms of bodyaches, chills, cough, congestion, subjective fevers, and SOB/chest tightness. Denies N/V/D, sore throat. Patient does have a hx of asthma and smoking.  Has an albuterol inhaler which she has been using with only temporary relief. no recent travel.  Reports exposure to RSV via family members.  Pt is not vaccinated for COVID. Pt is not vaccinated for flu this season. Pt has taken DayQuil/NyQuil OTC for symptoms. Pt has no other concerns at this time.    Cough Associated symptoms: chills, headaches, myalgias and shortness of breath   Shortness of Breath Associated symptoms: cough and headaches   Headache Associated symptoms: congestion, cough and myalgias     Past Medical History:  Diagnosis Date   Asthma Jan 2013   no occurences since then   Headache(784.0)    associated with menses   Hidradenitis axillaris 04/2013    Patient Active Problem List   Diagnosis Date Noted   Back pain and neck pain 03/25/2021   Healthcare maintenance 03/25/2021   Fibroids 05/01/2018   Ovarian cyst 05/01/2018   Pelvic pain 05/01/2018   Failed vision screen 05/23/2013   Failed hearing screening 05/23/2013   Body mass index, pediatric, 85th percentile to less than 95th percentile for age 11/20/2012   Hydradenitis 05/09/2013    Past Surgical History:  Procedure Laterality Date   INCISION AND DRAINAGE ABSCESS Bilateral 05/10/2013   Procedure: INCISION AND DRAINAGE of bilateral axillary hydradenitis;  Surgeon: Leighton Ruff, MD;  Location: [facility];  Service: General;  Laterality: Bilateral;    OB History     Gravida  0   Para  0   Term  0   Preterm  0   AB   0   Living  0      SAB  0   IAB  0   Ectopic  0   Multiple  0   Live Births  0            Home Medications    Prior to Admission medications   Medication Sig Start Date End Date Taking? Authorizing Provider  benzonatate (TESSALON) 200 MG capsule Take 1 capsule (200 mg total) by mouth 3 (three) times daily as needed for cough. 09/13/22  Yes Melynda Ripple, NP  fluticasone (FLOVENT HFA) 110 MCG/ACT inhaler Inhale 1 puff into the lungs in the morning and at bedtime for 7 days. 09/13/22 09/20/22 Yes Melynda Ripple, NP  albuterol (VENTOLIN HFA) 108 (90 Base) MCG/ACT inhaler Inhale 2 puffs into the lungs every 6 (six) hours as needed for wheezing or shortness of breath. 03/25/21   Timothy Lasso, MD    Family History Family History  Problem Relation Age of Onset   Cancer Maternal Aunt        Breast. diagnosed young   Cancer Maternal Grandmother        Breast   Lupus Brother     Social History Social History   Tobacco Use   Smoking status: Every Day    Types: Cigarettes   Smokeless tobacco: Never   Tobacco comments:    5 cigs/d  Vaping Use  Vaping Use: Never used  Substance Use Topics   Alcohol use: Yes    Alcohol/week: 5.0 standard drinks of alcohol    Types: 5 Shots of liquor per week   Drug use: Yes    Frequency: 3.0 times per week    Types: Marijuana     Allergies   Ibuprofen   Review of Systems Review of Systems  Constitutional:  Positive for chills.  HENT:  Positive for congestion.   Respiratory:  Positive for cough and shortness of breath.   Musculoskeletal:  Positive for myalgias.  Neurological:  Positive for headaches.     Physical Exam Triage Vital Signs ED Triage Vitals  Enc Vitals Group     BP 09/13/22 0829 (!) 152/99     Pulse Rate 09/13/22 0829 (!) 102     Resp 09/13/22 0829 18     Temp 09/13/22 0829 99.8 F (37.7 C)     Temp Source 09/13/22 0829 Oral     SpO2 09/13/22 0829 99 %     Weight --      Height --      Head  Circumference --      Peak Flow --      Pain Score 09/13/22 0828 10     Pain Loc --      Pain Edu? --      Excl. in [place]? --    No data found.  Updated Vital Signs BP (!) 152/99 (BP Location: Right Arm)   Pulse (!) 102   Temp 99.8 F (37.7 C) (Oral)   Resp 18   LMP 09/03/2022   SpO2 99%   Visual Acuity Right Eye Distance:   Left Eye Distance:   Bilateral Distance:    Right Eye Near:   Left Eye Near:    Bilateral Near:     Physical Exam Vitals and nursing note reviewed.  Constitutional:      General: She is not in acute distress.    Appearance: She is well-developed. She is not ill-appearing.  HENT:     Head: Normocephalic and atraumatic.     Right Ear: Tympanic membrane and ear canal normal.     Left Ear: Tympanic membrane and ear canal normal.     Nose: Congestion present.     Mouth/Throat:     Mouth: Mucous membranes are moist.     Pharynx: Oropharynx is clear. Uvula midline. No oropharyngeal exudate or posterior oropharyngeal erythema.     Tonsils: No tonsillar exudate or tonsillar abscesses.  Eyes:     Conjunctiva/sclera: Conjunctivae normal.     Pupils: Pupils are equal, round, and reactive to light.  Cardiovascular:     Rate and Rhythm: Normal rate and regular rhythm.     Heart sounds: Normal heart sounds.  Pulmonary:     Effort: Pulmonary effort is normal.     Breath sounds: Wheezing present.     Comments: Mild expiratory wheezing bilateral bases otherwise CTA to all other lung fields Musculoskeletal:     Cervical back: Normal range of motion and neck supple.  Lymphadenopathy:     Cervical: No cervical adenopathy.  Skin:    General: Skin is warm and dry.  Neurological:     General: No focal deficit present.     Mental Status: She is alert and oriented to person, place, and time.  Psychiatric:        Mood and Affect: Mood normal.        Behavior: Behavior normal.  UC Treatments / Results  Labs (all labs ordered are listed, but only abnormal  results are displayed) Labs Reviewed  POC INFLUENZA A AND B ANTIGEN (URGENT CARE ONLY)    EKG   Radiology No results found.  Procedures Procedures (including critical care time)  Medications Ordered in UC Medications  ipratropium-albuterol (DUONEB) 0.5-2.5 (3) MG/3ML nebulizer solution 3 mL (3 mLs Nebulization Given 09/13/22 0856)    Initial Impression / Assessment and Plan / UC Course  I have reviewed the triage vital signs and the nursing notes.  Pertinent labs & imaging results that were available during my care of the patient were reviewed by me and considered in my medical decision making (see chart for details).     Patient given DuoNeb in clinic with improvement in wheezing/chest tightness Negative POCT flu COVID PCR Start Flovent x 7 days Continue albuterol inhaler as needed Tessalon as needed cough If patient test positive for COVID this qualify for Paxlovid.  Denies any history of kidney or liver function issues and is not taking any prescribed or OTC medications daily. Rest and fluids Follow up with PCP in 2-3 days for re-check  ER precautions reviewed and pt verbalized understanding  Final Clinical Impressions(s) / UC Diagnoses   Final diagnoses:  Intermittent asthma with acute exacerbation, unspecified asthma severity  Viral illness  Body aches     Discharge Instructions      Flovent twice daily for one week  Continue your albuterol  Tessalon as needed for cough Rest and fluids Please follow up with your PCP in 2-3 days  Please go to the ER for any worsening symptoms  I hope you feel better soon!    ED Prescriptions     Medication Sig Dispense Auth. Provider   fluticasone (FLOVENT HFA) 110 MCG/ACT inhaler Inhale 1 puff into the lungs in the morning and at bedtime for 7 days. 1 each Melynda Ripple, NP   benzonatate (TESSALON) 200 MG capsule Take 1 capsule (200 mg total) by mouth 3 (three) times daily as needed for cough. 20 capsule Melynda Ripple, NP      PDMP not reviewed this encounter.   Melynda Ripple, NP 09/13/22 [phone]

## 2022-09-13 NOTE — ED Triage Notes (Signed)
Pt is here for cough, SOB, chest feels tight, body aches, headaches , back pain x2 day weak, loss of appetite x 2days

## 2022-09-13 NOTE — Discharge Instructions (Addendum)
Flovent twice daily for one week  Continue your albuterol  Tessalon as needed for cough Rest and fluids Please follow up with your PCP in 2-3 days  Please go to the ER for any worsening symptoms  I hope you feel better soon!

## 2023-03-28 ENCOUNTER — Ambulatory Visit (HOSPITAL_COMMUNITY)
Admission: EM | Admit: 2023-03-28 | Discharge: 2023-03-28 | Disposition: A | Discharge status: Home / Self Care | Payer: Medicaid Other | Attending: Internal Medicine | Admitting: Internal Medicine

## 2023-03-28 ENCOUNTER — Encounter (HOSPITAL_COMMUNITY): Payer: Self-pay

## 2023-03-28 DIAGNOSIS — L301 Dyshidrosis [pompholyx]: Secondary | ICD-10-CM

## 2023-03-28 MED ORDER — TRIAMCINOLONE ACETONIDE 0.1 % EX CREA: 1.0000 | TOPICAL_CREAM | Freq: Two times a day (BID) | CUTANEOUS | 0 refills | Status: AC

## 2023-03-28 NOTE — ED Provider Notes (Signed)
[name]    [name]: [id number] Arrival date & time: 03/28/23  0805      History   Chief Complaint Chief Complaint  Patient presents with   Rash    HPI MARSHELLE Fitzpatrick is a 27 y.o. female.   Patient presents to urgent care for evaluation of rash to bilateral hands that has been intermittent over the last 1 month.  Rash started as singular area of vesicles approximately 1 month ago to 1 finger and has spread to the other digits and palms of bilateral hands since then.  Rash is very itchy, nondraining, and nontender.  No recent exposure to sick contact with similar rash, recent new personal hygiene products, or exposure to new chemicals/irritants.  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  She states that symptoms are worse when she is at work and rash becomes more dry, patchy, and itchy.  History of asthma, denies history of eczema.  She attempted use of a cream provided to her by a coworker and states it did not help very much.  She has limited recall of the name of this cream.  This is never happened in the past.     Past Medical History:  Diagnosis Date   Asthma Jan 2013   no occurences since then   Headache(784.0)    associated with menses   Hidradenitis axillaris 04/2013    Patient Active Problem List   Diagnosis Date Noted   Back pain and neck pain 03/25/2021   Healthcare maintenance 03/25/2021   Fibroids 05/01/2018   Ovarian cyst 05/01/2018   Pelvic pain 05/01/2018   Failed vision screen 05/23/2013   Failed hearing screening 05/23/2013   Body mass index, pediatric, 85th percentile to less than 95th percentile for age 67/06/2013   Hydradenitis 05/09/2013    Past Surgical History:  Procedure Laterality Date   INCISION AND DRAINAGE ABSCESS Bilateral 05/10/2013   Procedure: INCISION AND DRAINAGE of bilateral axillary hydradenitis;  Surgeon: Romie Levee, MD;  Location: [facility];  Service:  General;  Laterality: Bilateral;    OB History     Gravida  0   Para  0   Term  0   Preterm  0   AB  0   Living  0      SAB  0   IAB  0   Ectopic  0   Multiple  0   Live Births  0            Home Medications    Prior to Admission medications   Medication Sig Start Date End Date Taking? Authorizing Provider  triamcinolone cream (KENALOG) 0.1 % Apply 1 Application topically 2 (two) times daily. 03/28/23  Yes Carlisle Beers, FNP  albuterol (VENTOLIN HFA) 108 (90 Base) MCG/ACT inhaler Inhale 2 puffs into the lungs every 6 (six) hours as needed for wheezing or shortness of breath. 03/25/21   Dellis Filbert, MD  fluticasone (FLOVENT HFA) 110 MCG/ACT inhaler Inhale 1 puff into the lungs in the morning and at bedtime for 7 days. 09/13/22 09/20/22  Radford Pax, NP    Family History Family History  Problem Relation Age of Onset   Cancer Maternal Aunt        Breast. diagnosed young   Cancer Maternal Grandmother        Breast   Lupus Brother     Social History Social History   Tobacco  Use   Smoking status: Every Day    Types: Cigarettes   Smokeless tobacco: Never   Tobacco comments:    5 cigs/d  Vaping Use   Vaping status: Never Used  Substance Use Topics   Alcohol use: Yes    Alcohol/week: 5.0 standard drinks of alcohol    Types: 5 Shots of liquor per week   Drug use: Yes    Frequency: 3.0 times per week    Types: Marijuana     Allergies   Ibuprofen   Review of Systems Review of Systems Per HPI  Physical Exam Triage Vital Signs ED Triage Vitals  Encounter Vitals Group     BP 03/28/23 0817 (!) 158/108     Systolic BP Percentile --      Diastolic BP Percentile --      Pulse Rate 03/28/23 0817 82     Resp 03/28/23 0817 18     Temp 03/28/23 0817 98.2 F (36.8 C)     Temp Source 03/28/23 0817 Oral     SpO2 03/28/23 0817 99 %     Weight --      Height --      Head Circumference --      Peak Flow --      Pain Score 03/28/23 0818 2      Pain Loc --      Pain Education --      Exclude from Growth Chart --    No data found.  Updated Vital Signs BP (!) 134/93 (BP Location: Left Arm)   Pulse 82   Temp 98.2 F (36.8 C) (Oral)   Resp 18   LMP 03/21/2023   SpO2 99%   Visual Acuity Right Eye Distance:   Left Eye Distance:   Bilateral Distance:    Right Eye Near:   Left Eye Near:    Bilateral Near:     Physical Exam Vitals and nursing note reviewed.  Constitutional:      Appearance: She is not ill-appearing or toxic-appearing.  HENT:     Head: Normocephalic and atraumatic.     Right Ear: Hearing and external ear normal.     Left Ear: Hearing and external ear normal.     Nose: Nose normal.     Mouth/Throat:     Lips: Pink.  Eyes:     General: Lids are normal. Vision grossly intact. Gaze aligned appropriately.     Extraocular Movements: Extraocular movements intact.     Conjunctiva/sclera: Conjunctivae normal.  Pulmonary:     Effort: Pulmonary effort is normal.  Musculoskeletal:     Cervical back: Neck supple.  Skin:    General: Skin is warm and dry.     Capillary Refill: Capillary refill takes less than 2 seconds.     Findings: Rash present.     Comments: Diffuse pinpoint vesicular lesions to the interdigital spaces of the bilateral hands as well as the palms.  No appreciable pustular lesions, erythema, signs of excoriation/lichenification, or warmth.  Nontender to palpation.  Full range of motion of the bilateral hands.  Sensation and strength intact distally.  Capillary refill is less than 3.  Strong bilateral radial pulses present.  See images below.  Neurological:     General: No focal deficit present.     Mental Status: She is alert and oriented to person, place, and time. Mental status is at baseline.     Cranial Nerves: No dysarthria or facial asymmetry.  Psychiatric:  Mood and Affect: Mood normal.        Speech: Speech normal.        Behavior: Behavior normal.        Thought Content:  Thought content normal.        Judgment: Judgment normal.             UC Treatments / Results  Labs (all labs ordered are listed, but only abnormal results are displayed) Labs Reviewed - No data to display  EKG   Radiology No results found.  Procedures Procedures (including critical care time)  Medications Ordered in UC Medications - No data to display  Initial Impression / Assessment and Plan / UC Course  I have reviewed the triage vital signs and the nursing notes.  Pertinent labs & imaging results that were available during my care of the patient were reviewed by me and considered in my medical decision making (see chart for details).   1.  Dyshidrotic eczema Evaluation suggests dyshidrotic eczema etiology.  Low suspicion for allergic dermatitis/contact dermatiti/tinea infection.  No signs of secondary bacterial infection. Will manage this with triamcinolone cream twice daily for the next 7 to 10 days with use of Aquaphor emollient over-the-counter in between applications of triamcinolone cream.  Aquaphor may be used long-term, discussed short-term use of triamcinolone cream for no longer than 10 days. Information regarding dyshidrotic eczema placed in after visit summary. Advised to avoid long exposure to water/moisture.  Advised to continue wearing gloves at work to protect hands. Advise follow-up with PCP in the next 3 to 5 days should symptoms fail to improve  Counseled patient on potential for adverse effects with medications prescribed/recommended today, strict ER and return-to-clinic precautions discussed, patient verbalized understanding.    Final Clinical Impressions(s) / UC Diagnoses   Final diagnoses:  Dyshidrotic eczema     Discharge Instructions      Your rash is due to inflammation of the skin on your hands. Apply triamcinolone cream to the hands twice daily for the next 7 to 10 days.  Do not use this longer than 10 days as it can make your  symptoms worse. In between applying triamcinolone cream to the hands, please apply Aquaphor ointment to the hands to keep them moist.  You may use Aquaphor ointment long-term to prevent this from happening again.  If your symptoms do not improve in the next 3 to 5 days with use of triamcinolone, please return to urgent care or follow-up with your primary care provider.    ED Prescriptions     Medication Sig Dispense Auth. Provider   triamcinolone cream (KENALOG) 0.1 % Apply 1 Application topically 2 (two) times daily. 30 g Carlisle Beers, FNP      PDMP not reviewed this encounter.   Reita May [place], [place] 03/28/23 [phone]

## 2023-03-28 NOTE — Discharge Instructions (Addendum)
Your rash is due to inflammation of the skin on your hands. Apply triamcinolone cream to the hands twice daily for the next 7 to 10 days.  Do not use this longer than 10 days as it can make your symptoms worse. In between applying triamcinolone cream to the hands, please apply Aquaphor ointment to the hands to keep them moist.  You may use Aquaphor ointment long-term to prevent this from happening again.  If your symptoms do not improve in the next 3 to 5 days with use of triamcinolone, please return to urgent care or follow-up with your primary care provider.

## 2023-03-28 NOTE — ED Triage Notes (Signed)
Pt c/o rash to bilateral hands and fingers for over a month. States now is painful and burning with skin peeling.

## 2023-05-21 ENCOUNTER — Ambulatory Visit (HOSPITAL_COMMUNITY)
Admission: EM | Admit: 2023-05-21 | Discharge: 2023-05-21 | Disposition: A | Discharge status: Home / Self Care | Payer: 59 | Attending: Physician Assistant | Admitting: Physician Assistant

## 2023-05-21 ENCOUNTER — Encounter (HOSPITAL_COMMUNITY): Payer: Self-pay

## 2023-05-21 DIAGNOSIS — L089 Local infection of the skin and subcutaneous tissue, unspecified: Secondary | ICD-10-CM

## 2023-05-21 DIAGNOSIS — S80211A Abrasion, right knee, initial encounter: Secondary | ICD-10-CM

## 2023-05-21 MED ORDER — TETANUS-DIPHTH-ACELL PERTUSSIS 5-2.5-18.5 LF-MCG/0.5 IM SUSY
PREFILLED_SYRINGE | INTRAMUSCULAR | Status: AC
  Filled 2023-05-21: qty 0.5

## 2023-05-21 MED ORDER — CEPHALEXIN 500 MG PO CAPS: 500.0000 mg | ORAL_CAPSULE | Freq: Three times a day (TID) | ORAL | 0 refills | Status: DC

## 2023-05-21 MED ORDER — MUPIROCIN 2 % EX OINT: 1.0000 | TOPICAL_OINTMENT | Freq: Every day | CUTANEOUS | 0 refills | Status: DC

## 2023-05-21 MED ORDER — TETANUS-DIPHTH-ACELL PERTUSSIS 5-2.5-18.5 LF-MCG/0.5 IM SUSY
0.5000 mL | PREFILLED_SYRINGE | Freq: Once | INTRAMUSCULAR | Status: AC
  Administered 2023-05-21: 0.5 mL via INTRAMUSCULAR

## 2023-05-21 NOTE — ED Triage Notes (Signed)
Patient states she scraped her right knee when fell on Thursday night. Oozing and green mucus on area. Treated with Neosporin and Peroxide.

## 2023-05-21 NOTE — Discharge Instructions (Signed)
We updated your tetanus today.  I am concerned that you are developing an infection.  Start cephalexin 3 times daily.  Take this with food sick and upset your stomach.  Keep the area clean with soap and water.  Do not use any alcohol or hydrogen peroxide anymore.  Stop Neosporin and use Bactroban ointment with dressing changes.  This can take several weeks to heal.  If anything worsens you have increasing pain, swelling, drainage, fever, nausea, vomiting you need to be seen immediately.

## 2023-05-21 NOTE — ED Provider Notes (Signed)
[name]    [name]: [id number] Arrival date & time: 05/21/23  1032      History   Chief Complaint No chief complaint on file.   HPI Jessica Fitzpatrick is a 27 y.o. female.   Patient presents today with a 2-day history of wound to her right knee.  Reports that she was skating when she fell and scraped her right anterior knee.  She did not hit her head and denies any focal bony tenderness.  She reports the only pain is related to the wound.  She has been cleaning this with hydrogen peroxide and applying Neosporin but has noticed that the drainage has increased prompting evaluation.  She denies history of recurrent skin infections including MRSA.  Denies any recent antibiotic use.  She denies any fever, nausea, vomiting.  Her last tetanus was in 2018.     Past Medical History:  Diagnosis Date   Asthma Jan 2013   no occurences since then   Headache(784.0)    associated with menses   Hidradenitis axillaris 04/2013    Patient Active Problem List   Diagnosis Date Noted   Back pain and neck pain 03/25/2021   Healthcare maintenance 03/25/2021   Fibroids 05/01/2018   Ovarian cyst 05/01/2018   Pelvic pain 05/01/2018   Failed vision screen 05/23/2013   Failed hearing screening 05/23/2013   Body mass index, pediatric, 85th percentile to less than 95th percentile for age 47/06/2013   Hydradenitis 05/09/2013    Past Surgical History:  Procedure Laterality Date   INCISION AND DRAINAGE ABSCESS Bilateral 05/10/2013   Procedure: INCISION AND DRAINAGE of bilateral axillary hydradenitis;  Surgeon: Romie Levee, MD;  Location: [facility];  Service: General;  Laterality: Bilateral;    OB History     Gravida  0   Para  0   Term  0   Preterm  0   AB  0   Living  0      SAB  0   IAB  0   Ectopic  0   Multiple  0   Live Births  0            Home Medications    Prior to Admission medications   Medication Sig Start Date End Date Taking? Authorizing Provider   cephALEXin (KEFLEX) 500 MG capsule Take 1 capsule (500 mg total) by mouth 3 (three) times daily. 05/21/23  Yes Kamonte Mcmichen, Denny Peon K, PA-C  mupirocin ointment (BACTROBAN) 2 % Apply 1 Application topically daily. 05/21/23  Yes Ajmal Kathan K, PA-C  albuterol (VENTOLIN HFA) 108 (90 Base) MCG/ACT inhaler Inhale 2 puffs into the lungs every 6 (six) hours as needed for wheezing or shortness of breath. 03/25/21   Dellis Filbert, MD  fluticasone (FLOVENT HFA) 110 MCG/ACT inhaler Inhale 1 puff into the lungs in the morning and at bedtime for 7 days. 09/13/22 09/20/22  Radford Pax, NP  triamcinolone cream (KENALOG) 0.1 % Apply 1 Application topically 2 (two) times daily. 03/28/23   Carlisle Beers, FNP    Family History Family History  Problem Relation Age of Onset   Cancer Maternal Aunt        Breast. diagnosed young   Cancer Maternal Grandmother        Breast   Lupus Brother     Social History Social History   Tobacco Use   Smoking status: Every Day    Types: Cigarettes   Smokeless tobacco: Never   Tobacco comments:  5 cigs/d  Vaping Use   Vaping status: Never Used  Substance Use Topics   Alcohol use: Yes    Alcohol/week: 5.0 standard drinks of alcohol    Types: 5 Shots of liquor per week   Drug use: Yes    Frequency: 3.0 times per week    Types: Marijuana     Allergies   Ibuprofen   Review of Systems Review of Systems  Constitutional:  Negative for activity change, appetite change, fatigue and fever.  Gastrointestinal:  Negative for abdominal pain, diarrhea, nausea and vomiting.  Musculoskeletal:  Negative for arthralgias, gait problem, joint swelling and myalgias.  Skin:  Positive for wound. Negative for color change.  Neurological:  Negative for weakness and numbness.     Physical Exam Triage Vital Signs ED Triage Vitals  Encounter Vitals Group     BP 05/21/23 1204 (!) 130/92     Systolic BP Percentile --      Diastolic BP Percentile --      Pulse Rate 05/21/23  1204 95     Resp 05/21/23 1204 18     Temp 05/21/23 1204 97.8 F (36.6 C)     Temp Source 05/21/23 1204 Oral     SpO2 05/21/23 1204 98 %     Weight 05/21/23 1205 165 lb (74.8 kg)     Height 05/21/23 1205 5\' 4"  (1.626 m)     Head Circumference --      Peak Flow --      Pain Score 05/21/23 1205 0     Pain Loc --      Pain Education --      Exclude from Growth Chart --    No data found.  Updated Vital Signs BP (!) 130/92 (BP Location: Left Arm)   Pulse 95   Temp 97.8 F (36.6 C) (Oral)   Resp 18   Ht 5\' 4"  (1.626 m)   Wt 165 lb (74.8 kg)   LMP 05/20/2023   SpO2 98%   BMI 28.32 kg/m   Visual Acuity Right Eye Distance:   Left Eye Distance:   Bilateral Distance:    Right Eye Near:   Left Eye Near:    Bilateral Near:     Physical Exam Vitals reviewed.  Constitutional:      General: She is awake. She is not in acute distress.    Appearance: Normal appearance. She is well-developed. She is not ill-appearing.     Comments: Very pleasant female presented age in no acute distress sitting comfortably in exam room  HENT:     Head: Normocephalic and atraumatic.  Cardiovascular:     Rate and Rhythm: Normal rate and regular rhythm.     Heart sounds: Normal heart sounds, S1 normal and S2 normal. No murmur heard. Pulmonary:     Effort: Pulmonary effort is normal.     Breath sounds: Normal breath sounds. No wheezing, rhonchi or rales.     Comments: Clear to auscultation bilaterally Musculoskeletal:     Right knee: No swelling or bony tenderness. Normal range of motion. No tenderness.  Skin:    Findings: Abrasion present.          Comments: 4 cm x 3 cm abrasion with purulent and serous drainage noted anterior right knee.  No streaking or evidence of lymphangitis.  No active bleeding.  Psychiatric:        Behavior: Behavior is cooperative.      UC Treatments / Results  Labs (all labs ordered  are listed, but only abnormal results are displayed) Labs Reviewed - No data  to display  EKG   Radiology No results found.  Procedures Procedures (including critical care time)  Medications Ordered in UC Medications  Tdap (BOOSTRIX) injection 0.5 mL (has no administration in time range)    Initial Impression / Assessment and Plan / UC Course  I have reviewed the triage vital signs and the nursing notes.  Pertinent labs & imaging results that were available during my care of the patient were reviewed by me and considered in my medical decision making (see chart for details).     Patient is well-appearing, afebrile, nontoxic, nontachycardic.  Plain films of knee were deferred as she had no focal tenderness in denies any significant pain.  Tetanus was last updated 2018 so given this was a dirty wound will repeat tetanus today.  Discussed that she should avoid alcohol and hydrogen peroxide but can keep this clean with soap and water.  She is to apply mupirocin and stop using Neosporin in case this is contributing to delayed healing.  I am concerned for the beginning of infection given increased drainage or so will cover with cephalexin 500 mg 3 times daily for 7 days.  Patient denies any risk factors for MRSA.  Discussed that if her symptoms worsen in any way and she has increasing pain, swelling, drainage, fever, nausea, vomiting she needs to be seen immediately.  Final Clinical Impressions(s) / UC Diagnoses   Final diagnoses:  Abrasion of right knee, initial encounter  Skin infection     Discharge Instructions      We updated your tetanus today.  I am concerned that you are developing an infection.  Start cephalexin 3 times daily.  Take this with food sick and upset your stomach.  Keep the area clean with soap and water.  Do not use any alcohol or hydrogen peroxide anymore.  Stop Neosporin and use Bactroban ointment with dressing changes.  This can take several weeks to heal.  If anything worsens you have increasing pain, swelling, drainage, fever, nausea,  vomiting you need to be seen immediately.     ED Prescriptions     Medication Sig Dispense Auth. Provider   mupirocin ointment (BACTROBAN) 2 % Apply 1 Application topically daily. 22 g Demiyah Fischbach K, PA-C   cephALEXin (KEFLEX) 500 MG capsule Take 1 capsule (500 mg total) by mouth 3 (three) times daily. 21 capsule Fiora Weill K, PA-C      PDMP not reviewed this encounter.   Jeani Hawking, PA-C 05/21/23 1233

## 2024-04-10 ENCOUNTER — Emergency Department (HOSPITAL_COMMUNITY)

## 2024-04-10 ENCOUNTER — Encounter (HOSPITAL_COMMUNITY): Payer: Self-pay

## 2024-04-10 ENCOUNTER — Emergency Department (HOSPITAL_COMMUNITY)
Admission: EM | Admit: 2024-04-10 | Discharge: 2024-04-10 | Disposition: A | Discharge status: Home / Self Care | Attending: Emergency Medicine | Admitting: Emergency Medicine

## 2024-04-10 DIAGNOSIS — D649 Anemia, unspecified: Secondary | ICD-10-CM | POA: Diagnosis not present

## 2024-04-10 DIAGNOSIS — N939 Abnormal uterine and vaginal bleeding, unspecified: Secondary | ICD-10-CM | POA: Diagnosis present

## 2024-04-10 DIAGNOSIS — E876 Hypokalemia: Secondary | ICD-10-CM | POA: Diagnosis not present

## 2024-04-10 LAB — CBC
HCT: 27 % — ABNORMAL LOW (ref 36.0–46.0)
Hemoglobin: 8 g/dL — ABNORMAL LOW (ref 12.0–15.0)
MCH: 21.2 pg — ABNORMAL LOW (ref 26.0–34.0)
MCHC: 29.6 g/dL — ABNORMAL LOW (ref 30.0–36.0)
MCV: 71.6 fL — ABNORMAL LOW (ref 80.0–100.0)
Platelets: 518 K/uL — ABNORMAL HIGH (ref 150–400)
RBC: 3.77 MIL/uL — ABNORMAL LOW (ref 3.87–5.11)
RDW: 21.5 % — ABNORMAL HIGH (ref 11.5–15.5)
WBC: 7.1 K/uL (ref 4.0–10.5)
nRBC: 0 % (ref 0.0–0.2)

## 2024-04-10 LAB — COMPREHENSIVE METABOLIC PANEL WITH GFR
ALT: 13 U/L (ref 0–44)
AST: 19 U/L (ref 15–41)
Albumin: 3.8 g/dL (ref 3.5–5.0)
Alkaline Phosphatase: 37 U/L — ABNORMAL LOW (ref 38–126)
Anion gap: 8 (ref 5–15)
BUN: 7 mg/dL (ref 6–20)
CO2: 21 mmol/L — ABNORMAL LOW (ref 22–32)
Calcium: 8.7 mg/dL — ABNORMAL LOW (ref 8.9–10.3)
Chloride: 107 mmol/L (ref 98–111)
Creatinine, Ser: 0.85 mg/dL (ref 0.44–1.00)
GFR, Estimated: >60 mL/min (ref >60)
Glucose, Bld: 98 mg/dL (ref 70–99)
Potassium: 3.2 mmol/L — ABNORMAL LOW (ref 3.5–5.1)
Sodium: 136 mmol/L (ref 135–145)
Total Bilirubin: 0.4 mg/dL (ref 0.0–1.2)
Total Protein: 7.1 g/dL (ref 6.5–8.1)

## 2024-04-10 LAB — HCG, SERUM, QUALITATIVE: Preg, Serum: NEGATIVE

## 2024-04-10 MED ORDER — OXYCODONE-ACETAMINOPHEN 5-325 MG PO TABS
1.0000 | ORAL_TABLET | Freq: Once | ORAL | Status: AC
  Administered 2024-04-10: 1 via ORAL
  Filled 2024-04-10: qty 1

## 2024-04-10 MED ORDER — DIPHENHYDRAMINE HCL 25 MG PO CAPS
25.0000 mg | ORAL_CAPSULE | Freq: Once | ORAL | Status: AC
  Administered 2024-04-10: 25 mg via ORAL
  Filled 2024-04-10: qty 1

## 2024-04-10 MED ORDER — DIPHENHYDRAMINE HCL 25 MG PO TABS: 25.0000 mg | ORAL_TABLET | Freq: Four times a day (QID) | ORAL | 0 refills | Status: AC | PRN

## 2024-04-10 MED ORDER — IBUPROFEN 800 MG PO TABS: 800.0000 mg | ORAL_TABLET | Freq: Three times a day (TID) | ORAL | 0 refills | Status: AC

## 2024-04-10 MED ORDER — ONDANSETRON 4 MG PO TBDP
4.0000 mg | ORAL_TABLET | Freq: Once | ORAL | Status: AC
  Administered 2024-04-10: 4 mg via ORAL
  Filled 2024-04-10: qty 1

## 2024-04-10 MED ORDER — MEGESTROL ACETATE 40 MG PO TABS: 40.0000 mg | ORAL_TABLET | Freq: Two times a day (BID) | ORAL | 0 refills | Status: AC

## 2024-04-10 NOTE — ED Triage Notes (Signed)
 Pt states that she started her period yesterday and it is heavier then normal making her feel weak and dizzy.

## 2024-04-10 NOTE — ED Notes (Signed)
 Pt just returned from [facility] . Pt asleep

## 2024-04-10 NOTE — ED Provider Notes (Signed)
 [place] EMERGENCY DEPARTMENT AT [facility] Provider Note   [name]: [id number] Arrival date & time: 04/10/24  9760     Patient presents with: Vaginal Bleeding   Jessica Fitzpatrick is a 28 y.o. female.   28 year old female with a past medical history of anemia presents to the ED with a chief complaint of vaginal bleeding which has been ongoing for the past 2 days.  She reports this as heavy, bright red clots which have not occurred in the past.  She is endorsing suprapubic pain, does have a known history of bilateral ovarian cyst, she reports she takes ibuprofen  for pain control however has had some allergic reaction to it.  She reports developing pruritus, she did take ibuprofen  prior to arrival in the ED.  She feels that this menstrual cycle is heavier than prior with changing 2 pads every hour.  She is also endorsing feeling generalized weakness along with dizziness.  No prior history of blood transfusions.  No concern for sexually transmitted infection.  No fevers or other complaints reported.    The history is provided by the patient.  Vaginal Bleeding Quality:  Bright red and clots Severity:  Moderate Onset quality:  Sudden Duration:  2 days Number of pads used:  2 Possible pregnancy: no   Relieved by:  Nothing Ineffective treatments:  Ibuprofen  Associated symptoms: back pain   Associated symptoms: no abdominal pain, no fever and no nausea   Risk factors: ovarian cysts   Risk factors: no bleeding disorder, no hx of ectopic pregnancy and does not have multiple partners        Prior to Admission medications   Medication Sig Start Date End Date Taking? Authorizing Provider  diphenhydrAMINE  (BENADRYL ) 25 MG tablet Take 1 tablet (25 mg total) by mouth every 6 (six) hours as needed for up to 7 days. 04/10/24 04/17/24 Yes Rayon Mcchristian, PA-C  ibuprofen  (ADVIL ) 800 MG tablet Take 1 tablet (800 mg total) by mouth 3 (three) times daily for 15 days. 04/10/24 04/25/24 Yes Waynetta Metheny,  Rhilee Currin, PA-C  megestrol  (MEGACE ) 40 MG tablet Take 1 tablet (40 mg total) by mouth 2 (two) times daily. 04/10/24 05/10/24 Yes Mindy Behnken, PA-C  albuterol  (VENTOLIN  HFA) 108 (90 Base) MCG/ACT inhaler Inhale 2 puffs into the lungs every 6 (six) hours as needed for wheezing or shortness of breath. 03/25/21   Ariwodo, Shirley, MD  cephALEXin  (KEFLEX ) 500 MG capsule Take 1 capsule (500 mg total) by mouth 3 (three) times daily. 05/21/23   Raspet, Erin K, PA-C  fluticasone  (FLOVENT  HFA) 110 MCG/ACT inhaler Inhale 1 puff into the lungs in the morning and at bedtime for 7 days. 09/13/22 09/20/22  Mayer, Jodi R, NP  mupirocin  ointment (BACTROBAN ) 2 % Apply 1 Application topically daily. 05/21/23   Raspet, Erin K, PA-C  triamcinolone  cream (KENALOG ) 0.1 % Apply 1 Application topically 2 (two) times daily. 03/28/23   Enedelia Dorna HERO, FNP    Allergies: Ibuprofen     Review of Systems  Constitutional:  Negative for chills and fever.  HENT:  Negative for sore throat.   Respiratory:  Negative for shortness of breath.   Cardiovascular:  Negative for chest pain.  Gastrointestinal:  Negative for abdominal pain, nausea and vomiting.  Genitourinary:  Positive for vaginal bleeding.  Musculoskeletal:  Positive for back pain.  All other systems reviewed and are negative.   Updated Vital Signs BP (!) 150/96 (BP Location: Right Arm)   Pulse 83   Temp 98.8 F (37.1  C)   Resp 15   LMP 04/09/2024   SpO2 100%   Physical Exam Vitals and nursing note reviewed.  Constitutional:      Appearance: Normal appearance.  HENT:     Head: Normocephalic and atraumatic.  Eyes:     Pupils: Pupils are equal, round, and reactive to light.  Cardiovascular:     Rate and Rhythm: Normal rate.  Pulmonary:     Effort: Pulmonary effort is normal.  Abdominal:     General: Abdomen is flat.     Tenderness: There is abdominal tenderness. There is no right CVA tenderness or left CVA tenderness.  Musculoskeletal:     Cervical back:  Normal range of motion and neck supple.  Skin:    General: Skin is warm and dry.  Neurological:     Mental Status: She is alert and oriented to person, place, and time.     (all labs ordered are listed, but only abnormal results are displayed) Labs Reviewed  CBC - Abnormal; Notable for the following components:      Result Value   RBC 3.77 (*)    Hemoglobin 8.0 (*)    HCT 27.0 (*)    MCV 71.6 (*)    MCH 21.2 (*)    MCHC 29.6 (*)    RDW 21.5 (*)    Platelets 518 (*)    All other components within normal limits  COMPREHENSIVE METABOLIC PANEL WITH GFR - Abnormal; Notable for the following components:   Potassium 3.2 (*)    CO2 21 (*)    Calcium 8.7 (*)    Alkaline Phosphatase 37 (*)    All other components within normal limits  HCG, SERUM, QUALITATIVE  URINALYSIS, ROUTINE W REFLEX MICROSCOPIC    EKG: EKG Interpretation Date/Time:  Tuesday April 10 2024 02:49:34 EDT Ventricular Rate:  81 PR Interval:  148 QRS Duration:  86 QT Interval:  388 QTC Calculation: 450 R Axis:   -15  Text Interpretation: Normal sinus rhythm Minimal voltage criteria for LVH, may be normal variant ( R in aVL ) Borderline ECG When compared with ECG of 01-Mar-2017 22:59, PREVIOUS ECG IS PRESENT No significant change since last tracing Confirmed by Dasie Faden ([id number]) on 04/10/2024 9:41:16 AM  Radiology: US  Pelvis Complete Result Date: 04/10/2024 CLINICAL DATA:  28 year old female with unusually heavy vaginal bleeding. Dizziness, weakness. EXAM: TRANSABDOMINAL ULTRASOUND OF PELVIS DOPPLER ULTRASOUND OF OVARIES TECHNIQUE: Transabdominal ultrasound examination of the pelvis was performed including evaluation of the uterus, ovaries, adnexal regions, and pelvic cul-de-sac. Color and duplex Doppler ultrasound was utilized to evaluate blood flow to the ovaries. Not sexually active, no transvaginal imaging. COMPARISON:  CT Abdomen and Pelvis 07/26/2015. Previous pelvis ultrasound 06/01/2018. FINDINGS: Uterus  Measurements: 12.4 x 7.6 x 10.4 cm = volume: 513 mL. Multilobulated and heterogeneous fibroid uterus. Individual uterine fibroids estimated up to 6.6 cm (image 50). These appear to be intramural and/or submucosal. Endometrium Not well visualized, probably distorted from multiple uterine fibroids above. Right ovary Measurements: 5.6 x 3.2 x 3.1 cm = volume: 30 mL. Normal appearance/no adnexal mass. Left ovary Could not be identified despite multiple attempts. Pulsed Doppler evaluation: Faint power Doppler and venous waveforms detected within the right ovary. Other: No pelvis free fluid identified. IMPRESSION: 1. Dominant finding is Fibroid Uterus, chronic, with individual fibroids now estimated up to 6.6 cm. And poor visualization of the endometrial stripe suggests at least some of the fibroids are Submucosal. Consider GYN consultation given dysfunctional uterine bleeding. 2. Left ovary  could not be identified, and suboptimal visualization of the right ovary despite multiple attempts. No strong evidence of right ovarian torsion. 3. No pelvis free fluid. Electronically Signed   By: VEAR Hurst M.D.   On: 04/10/2024 11:55   US  Art/Ven Flow Abd Pelv Doppler Result Date: 04/10/2024 CLINICAL DATA:  28 year old female with unusually heavy vaginal bleeding. Dizziness, weakness. EXAM: TRANSABDOMINAL ULTRASOUND OF PELVIS DOPPLER ULTRASOUND OF OVARIES TECHNIQUE: Transabdominal ultrasound examination of the pelvis was performed including evaluation of the uterus, ovaries, adnexal regions, and pelvic cul-de-sac. Color and duplex Doppler ultrasound was utilized to evaluate blood flow to the ovaries. Not sexually active, no transvaginal imaging. COMPARISON:  CT Abdomen and Pelvis 07/26/2015. Previous pelvis ultrasound 06/01/2018. FINDINGS: Uterus Measurements: 12.4 x 7.6 x 10.4 cm = volume: 513 mL. Multilobulated and heterogeneous fibroid uterus. Individual uterine fibroids estimated up to 6.6 cm (image 50). These appear to be  intramural and/or submucosal. Endometrium Not well visualized, probably distorted from multiple uterine fibroids above. Right ovary Measurements: 5.6 x 3.2 x 3.1 cm = volume: 30 mL. Normal appearance/no adnexal mass. Left ovary Could not be identified despite multiple attempts. Pulsed Doppler evaluation: Faint power Doppler and venous waveforms detected within the right ovary. Other: No pelvis free fluid identified. IMPRESSION: 1. Dominant finding is Fibroid Uterus, chronic, with individual fibroids now estimated up to 6.6 cm. And poor visualization of the endometrial stripe suggests at least some of the fibroids are Submucosal. Consider GYN consultation given dysfunctional uterine bleeding. 2. Left ovary could not be identified, and suboptimal visualization of the right ovary despite multiple attempts. No strong evidence of right ovarian torsion. 3. No pelvis free fluid. Electronically Signed   By: VEAR Hurst M.D.   On: 04/10/2024 11:55     Procedures   Medications Ordered in the ED  oxyCODONE -acetaminophen  (PERCOCET/ROXICET) 5-325 MG per tablet 1 tablet (1 tablet Oral Given 04/10/24 0752)  ondansetron  (ZOFRAN -ODT) disintegrating tablet 4 mg (4 mg Oral Given 04/10/24 0753)  diphenhydrAMINE  (BENADRYL ) capsule 25 mg (25 mg Oral Given 04/10/24 0941)    Clinical Course as of 04/10/24 1230  Tue Apr 10, 2024  0842 Hemoglobin(!): 8.0 Significant low from baseline [JS]    Clinical Course User Index [JS] Chaunice Obie, PA-C                                 Medical Decision Making Amount and/or Complexity of Data Reviewed Labs: ordered. Decision-making details documented in ED Course. Radiology: ordered.  Risk OTC drugs. Prescription drug management.        This patient presents to the ED for concern of vaginal bleeding, this involves a number of treatment options, and is a complaint that carries with it a high risk of complications and morbidity.  The differential diagnosis includes pregnancy,  AUB versus ovarian cyst.    Co morbidities: Discussed in HPI   Brief History:  See HPI.   EMR reviewed including pt PMHx, past surgical history and past visits to ER.   See HPI for more details   Lab Tests:  I ordered and independently interpreted labs.  The pertinent results include:    No leukocytosis, hemoglobin is low at 8.0, prior level was way higher. CMP with mild hypokalemia. Creatine is normal, BUN is normal. LFTs are within normal limits. Hcg is negative, is not sexually active with a female partner, reports no prior hx of female interaction.   Imaging Studies:  IMPRESSION:  1. Dominant finding is Fibroid Uterus, chronic, with individual  fibroids now estimated up to 6.6 cm. And poor visualization of the  endometrial stripe suggests at least some of the fibroids are  Submucosal.  Consider GYN consultation given dysfunctional uterine bleeding.  2. Left ovary could not be identified, and suboptimal visualization  of the right ovary despite multiple attempts. No strong evidence of  right ovarian torsion.  3. No pelvis free fluid.   Cardiac Monitoring:  The patient was maintained on a cardiac monitor.  I personally viewed and interpreted the cardiac monitored which showed an underlying rhythm of: NSR 81 EKG non-ischemic   Medicines ordered:  I ordered medication including benadryl , zofran , percocet  for symptomatic treatment  Reevaluation of the patient after these medicines showed that the patient improved I have reviewed the patients home medicines and have made adjustments as needed  Consults:  I requested consultation with Dr. Zina,  and discussed lab and imaging findings as well as pertinent plan - they recommend: megace  40 mg BID x 30 days or until she follows up with gynecology.   Reevaluation:  After the interventions noted above I re-evaluated patient and found that they have :improved  Social Determinants of Health:  The patient's social  determinants of health were a factor in the care of this patient   Problem List / ED Course:  Patient present to the ED with a chief complaint of vaginal bleeding has been ongoing for the past 3 days, does report that this is likely her cycle, however is been heavier than usual.  She is also endorsing some suprapubic pain.  No nausea, no vomiting, no fever or diarrhea.  Blood work here is remarkable for a hemoglobin that is lower than her baseline at 8.0 from a baseline of 12 from 6 years ago.  CMP with no electrolyte derangement aside from some decrease in her potassium.  Creatinine levels normal.  LFTs are within normal limits.  hCG is negative.  UA is pending however she denies any urinary symptoms at this time. Patient is currently sexually active with female partners only, denies any female interaction and the past.  She does not want a transvaginal exam at this time.  I did discuss with her obtaining a pelvic exam, she also deferred this at this time.  I discussed the case with Dr. Zina, seeing as patient's hemoglobin was trending downward from her prior.  He recommends Megace  40 mg twice daily for the next 30 days, will urged patient to follow-up with gynecology at her earliest availability.  She will also return to the emergency department if her symptoms do worsen.   Dispostion:  After consideration of the diagnostic results and the patients response to treatment, I feel that the patent would benefit from treatment of her AUB with Megace  for the next 30 days.      Final diagnoses:  Vaginal bleeding    ED Discharge Orders          Ordered    megestrol  (MEGACE ) 40 MG tablet  2 times daily        04/10/24 1221    ibuprofen  (ADVIL ) 800 MG tablet  3 times daily        04/10/24 1224    diphenhydrAMINE  (BENADRYL ) 25 MG tablet  Every 6 hours PRN        04/10/24 1224               Brae Schaafsma, PA-C 04/10/24 1230  Dasie Faden, MD 04/11/24 [phone]

## 2024-04-10 NOTE — ED Notes (Signed)
 Patient transported to [place]

## 2024-04-10 NOTE — ED Notes (Signed)
 Pt provided with benadryl  due to itchy after pain medication

## 2024-04-10 NOTE — ED Notes (Signed)
 ED PA at bedside

## 2024-04-10 NOTE — ED Notes (Signed)
US notified patient is ready.

## 2024-04-10 NOTE — ED Notes (Signed)
 US  unable to do transvaginal because pt has never been sexually active. They are also requesting pt to drink 32 ounces of water so they can see ovaries. EDPA advised and patient provided with water

## 2024-04-10 NOTE — Discharge Instructions (Addendum)
 You were given medication in order to help control your bleeding, please take 1 tablet twice a day for the next 30 days.  You will continue to take this medication until you follow-up with OB/GYN.  You were provided with 2 phone numbers to help schedule an appointment as soon as possible in order to be evaluated.  If you experience any worsening weakness he will need to return to the emergency department.

## 2024-04-13 ENCOUNTER — Ambulatory Visit (INDEPENDENT_AMBULATORY_CARE_PROVIDER_SITE_OTHER)

## 2024-04-13 ENCOUNTER — Encounter (HOSPITAL_COMMUNITY): Payer: Self-pay

## 2024-04-13 ENCOUNTER — Ambulatory Visit (HOSPITAL_COMMUNITY)
Admission: EM | Admit: 2024-04-13 | Discharge: 2024-04-13 | Disposition: A | Discharge status: Home / Self Care | Attending: Family Medicine | Admitting: Family Medicine

## 2024-04-13 DIAGNOSIS — M25572 Pain in left ankle and joints of left foot: Secondary | ICD-10-CM | POA: Diagnosis not present

## 2024-04-13 NOTE — ED Provider Notes (Addendum)
 [name]    [name]: [id number] Arrival date & time: 04/13/24  0807      History   Chief Complaint Chief Complaint  Patient presents with   Ankle Pain    HPI Jessica Fitzpatrick is a 28 y.o. female.    Ankle Pain Here for left ankle pain.  Yesterday she stepped off wrong and rolled her left ankle.  She now has pain when bearing weight on her lateral ankle.  She has been taken some ibuprofen  with some relief.  Her chart lists ibuprofen  as an allergy but she states she is taking it as needed without any problem.  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    Patient Active Problem List   Diagnosis Date Noted   Back pain and neck pain 03/25/2021   Healthcare maintenance 03/25/2021   Fibroids 05/01/2018   Ovarian cyst 05/01/2018   Pelvic pain 05/01/2018   Failed vision screen 05/23/2013   Failed hearing screening 05/23/2013   Body mass index, pediatric, 85th percentile to less than 95th percentile for age 17/06/2013   Hydradenitis 05/09/2013    Past Surgical History:  Procedure Laterality Date   INCISION AND DRAINAGE ABSCESS Bilateral 05/10/2013   Procedure: INCISION AND DRAINAGE of bilateral axillary hydradenitis;  Surgeon: Bernarda Ned, MD;  Location: [facility];  Service: General;  Laterality: Bilateral;    OB History     Gravida  0   Para  0   Term  0   Preterm  0   AB  0   Living  0      SAB  0   IAB  0   Ectopic  0   Multiple  0   Live Births  0            Home Medications    Prior to Admission medications   Medication Sig Start Date End Date Taking? Authorizing Provider  albuterol  (VENTOLIN  HFA) 108 (90 Base) MCG/ACT inhaler Inhale 2 puffs into the lungs every 6 (six) hours as needed for wheezing or shortness of breath. 03/25/21   Ariwodo, Shirley, MD  diphenhydrAMINE   (BENADRYL ) 25 MG tablet Take 1 tablet (25 mg total) by mouth every 6 (six) hours as needed for up to 7 days. 04/10/24 04/17/24  Soto, Johana, PA-C  fluticasone  (FLOVENT  HFA) 110 MCG/ACT inhaler Inhale 1 puff into the lungs in the morning and at bedtime for 7 days. 09/13/22 09/20/22  Mayer, Jodi R, NP  ibuprofen  (ADVIL ) 800 MG tablet Take 1 tablet (800 mg total) by mouth 3 (three) times daily for 15 days. 04/10/24 04/25/24  Soto, Johana, PA-C  megestrol  (MEGACE ) 40 MG tablet Take 1 tablet (40 mg total) by mouth 2 (two) times daily. 04/10/24 05/10/24  Soto, Johana, PA-C  triamcinolone  cream (KENALOG ) 0.1 % Apply 1 Application topically 2 (two) times daily. 03/28/23   Enedelia Dorna HERO, FNP    Family History Family History  Problem Relation Age of Onset   Cancer Maternal Aunt        Breast. diagnosed young   Cancer Maternal Grandmother        Breast   Lupus Brother     Social History Social History   Tobacco Use   Smoking status: Every Day    Types: E-cigarettes   Smokeless tobacco: Never  Tobacco comments:    5 cigs/d  Vaping Use   Vaping status: Every Day  Substance Use Topics   Alcohol use: Yes    Alcohol/week: 5.0 standard drinks of alcohol    Types: 5 Shots of liquor per week    Comment: Occasionally   Drug use: Yes    Frequency: 3.0 times per week    Types: Marijuana     Allergies   Ibuprofen    Review of Systems Review of Systems   Physical Exam Triage Vital Signs ED Triage Vitals  Encounter Vitals Group     BP 04/13/24 0827 (!) 139/91     Girls Systolic BP Percentile --      Girls Diastolic BP Percentile --      Boys Systolic BP Percentile --      Boys Diastolic BP Percentile --      Pulse Rate 04/13/24 0827 78     Resp 04/13/24 0827 16     Temp 04/13/24 0827 98 F (36.7 C)     Temp Source 04/13/24 0827 Oral     SpO2 04/13/24 0827 97 %     Weight --      Height --      Head Circumference --      Peak Flow --      Pain Score 04/13/24 0822 7     Pain Loc  --      Pain Education --      Exclude from Growth Chart --    No data found.  Updated Vital Signs BP (!) 139/91 (BP Location: Left Arm)   Pulse 78   Temp 98 F (36.7 C) (Oral)   Resp 16   LMP 04/09/2024 (Exact Date)   SpO2 97%   Visual Acuity Right Eye Distance:   Left Eye Distance:   Bilateral Distance:    Right Eye Near:   Left Eye Near:    Bilateral Near:     Physical Exam Vitals reviewed.  Constitutional:      General: She is not in acute distress.    Appearance: She is not toxic-appearing.  Musculoskeletal:     Comments: No edema.  There is some tenderness of the left lateral ankle.  Skin:    Coloration: Skin is not jaundiced or pale.  Neurological:     Mental Status: She is alert and oriented to person, place, and time.  Psychiatric:        Behavior: Behavior normal.      UC Treatments / Results  Labs (all labs ordered are listed, but only abnormal results are displayed) Labs Reviewed - No data to display  EKG   Radiology DG Ankle Complete Left Result Date: 04/13/2024 CLINICAL DATA:  Left ankle pain after rolling injury today. EXAM: LEFT ANKLE COMPLETE - 3+ VIEW COMPARISON:  March 30, 2015. FINDINGS: There is no evidence of fracture, dislocation, or joint effusion. There is no evidence of arthropathy or other focal bone abnormality. Soft tissues are unremarkable. IMPRESSION: Negative. Electronically Signed   By: Lynwood Landy Raddle M.D.   On: 04/13/2024 08:51    Procedures Procedures (including critical care time)  Medications Ordered in UC Medications - No data to display  Initial Impression / Assessment and Plan / UC Course  I have reviewed the triage vital signs and the nursing notes.  Pertinent labs & imaging results that were available during my care of the patient were reviewed by me and considered in my medical decision making (see chart for  details).     X-rays do not show any fractures.  Compression boot is supplied. Work note is  supplied  She states that though she is allergic to ibuprofen  she still takes it because it works the best for her pain and she takes Benadryl  along with that. She declines my offer of a different prescription pain Final Clinical Impressions(s) / UC Diagnoses   Final diagnoses:  Acute left ankle pain     Discharge Instructions      There are no broken bones on your x-rays  You can continue taking over-the-counter pain relief like ibuprofen  or Tylenol  as needed.  Ice and elevate your ankle       ED Prescriptions   None    PDMP not reviewed this encounter.   Vonna Sharlet POUR, MD 04/13/24 9090    Vonna Sharlet POUR, MD 04/13/24 [phone]

## 2024-04-13 NOTE — Discharge Instructions (Signed)
 There are no broken bones on your x-rays  You can continue taking over-the-counter pain relief like ibuprofen  or Tylenol  as needed.  Ice and elevate your ankle

## 2024-04-13 NOTE — ED Triage Notes (Signed)
 Patient here today with c/o lateral left ankle pain after rolling it when stepping down off a step. She took Ibuprofen  with some relief. She has increased pain with weightbearing.

## 2024-05-24 ENCOUNTER — Encounter (HOSPITAL_COMMUNITY): Payer: Self-pay

## 2024-05-24 ENCOUNTER — Ambulatory Visit (INDEPENDENT_AMBULATORY_CARE_PROVIDER_SITE_OTHER)

## 2024-05-24 ENCOUNTER — Ambulatory Visit (HOSPITAL_COMMUNITY)
Admission: EM | Admit: 2024-05-24 | Discharge: 2024-05-24 | Disposition: A | Discharge status: Home / Self Care | Attending: Emergency Medicine | Admitting: Emergency Medicine

## 2024-05-24 DIAGNOSIS — M79641 Pain in right hand: Secondary | ICD-10-CM | POA: Diagnosis not present

## 2024-05-24 MED ORDER — NAPROXEN 375 MG PO TABS: 375.0000 mg | ORAL_TABLET | Freq: Two times a day (BID) | ORAL | 0 refills | Status: DC

## 2024-05-24 NOTE — ED Provider Notes (Signed)
 [name]    [name]: [id number] Arrival date & time: 05/24/24  1411      History   Chief Complaint Chief Complaint  Patient presents with   Hand Pain    HPI Jessica Fitzpatrick is a 28 y.o. female.   Patient presents to clinic over concern of right hand pain that developed suddenly at work last night.  Patient was lifting and moving boxes when she had a sudden pain in the right hand, along the fourth metacarpal.  Denies injury.  Is right-handed.  Has not had any swelling or bruising.  At work a Radio broadcast assistant gave her some topical pain relief spray.  Woke up this morning and was still having pain in the hand so she decided to come into urgent care.  Has not had any oral medications or interventions other than the topical spray.  Denies previous injuries to this hand.  The history is provided by the patient and medical records.  Hand Pain    Past Medical History:  Diagnosis Date   Asthma Jan 2013   no occurences since then   Headache(784.0)    associated with menses   Hidradenitis axillaris 04/2013    Patient Active Problem List   Diagnosis Date Noted   Back pain and neck pain 03/25/2021   Healthcare maintenance 03/25/2021   Fibroids 05/01/2018   Ovarian cyst 05/01/2018   Pelvic pain 05/01/2018   Failed vision screen 05/23/2013   Failed hearing screening 05/23/2013   Body mass index, pediatric, 85th percentile to less than 95th percentile for age 60/06/2013   Hydradenitis 05/09/2013    Past Surgical History:  Procedure Laterality Date   INCISION AND DRAINAGE ABSCESS Bilateral 05/10/2013   Procedure: INCISION AND DRAINAGE of bilateral axillary hydradenitis;  Surgeon: Bernarda Ned, MD;  Location: [facility];  Service: General;  Laterality: Bilateral;    OB History     Gravida  0   Para  0   Term  0   Preterm  0   AB  0   Living  0      SAB  0   IAB  0   Ectopic  0   Multiple  0   Live Births  0            Home Medications    Prior to  Admission medications   Medication Sig Start Date End Date Taking? Authorizing Provider  naproxen  (NAPROSYN ) 375 MG tablet Take 1 tablet (375 mg total) by mouth 2 (two) times daily. 05/24/24  Yes Ashad Fawbush  N, FNP  albuterol  (VENTOLIN  HFA) 108 (90 Base) MCG/ACT inhaler Inhale 2 puffs into the lungs every 6 (six) hours as needed for wheezing or shortness of breath. 03/25/21   Ariwodo, Shirley, MD  diphenhydrAMINE  (BENADRYL ) 25 MG tablet Take 1 tablet (25 mg total) by mouth every 6 (six) hours as needed for up to 7 days. 04/10/24 04/17/24  Soto, Johana, PA-C  fluticasone  (FLOVENT  HFA) 110 MCG/ACT inhaler Inhale 1 puff into the lungs in the morning and at bedtime for 7 days. 09/13/22 09/20/22  Mayer, Jodi R, NP  triamcinolone  cream (KENALOG ) 0.1 % Apply 1 Application topically 2 (two) times daily. 03/28/23   Enedelia Dorna HERO, FNP    Family History Family History  Problem Relation Age of Onset   Cancer Maternal Aunt        Breast. diagnosed young   Cancer Maternal Grandmother        Breast   Lupus Brother  Social History Social History   Tobacco Use   Smoking status: Every Day    Types: E-cigarettes   Smokeless tobacco: Never   Tobacco comments:    5 cigs/d  Vaping Use   Vaping status: Every Day  Substance Use Topics   Alcohol use: Yes    Alcohol/week: 5.0 standard drinks of alcohol    Types: 5 Shots of liquor per week    Comment: Occasionally   Drug use: Yes    Frequency: 3.0 times per week    Types: Marijuana     Allergies   Ibuprofen    Review of Systems Review of Systems  Per HPI  Physical Exam Triage Vital Signs ED Triage Vitals  Encounter Vitals Group     BP 05/24/24 1435 (!) 155/91     Girls Systolic BP Percentile --      Girls Diastolic BP Percentile --      Boys Systolic BP Percentile --      Boys Diastolic BP Percentile --      Pulse Rate 05/24/24 1435 80     Resp 05/24/24 1435 16     Temp 05/24/24 1435 98.3 F (36.8 C)     Temp Source 05/24/24  1435 Oral     SpO2 05/24/24 1435 99 %     Weight --      Height --      Head Circumference --      Peak Flow --      Pain Score 05/24/24 1433 7     Pain Loc --      Pain Education --      Exclude from Growth Chart --    No data found.  Updated Vital Signs BP (!) 155/91 (BP Location: Left Arm)   Pulse 80   Temp 98.3 F (36.8 C) (Oral)   Resp 16   LMP 05/06/2024 (Exact Date)   SpO2 99%   Visual Acuity Right Eye Distance:   Left Eye Distance:   Bilateral Distance:    Right Eye Near:   Left Eye Near:    Bilateral Near:     Physical Exam Vitals and nursing note reviewed.  Constitutional:      Appearance: Normal appearance.  HENT:     Head: Normocephalic and atraumatic.     Right Ear: External ear normal.     Left Ear: External ear normal.     Nose: Nose normal.     Mouth/Throat:     Mouth: Mucous membranes are moist.  Eyes:     Conjunctiva/sclera: Conjunctivae normal.  Cardiovascular:     Rate and Rhythm: Normal rate.     Pulses: Normal pulses.  Pulmonary:     Effort: Pulmonary effort is normal. No respiratory distress.  Musculoskeletal:        General: Tenderness present. No swelling, deformity or signs of injury.     Right hand: Tenderness present. No swelling. Normal range of motion. Normal strength. There is no disruption of two-point discrimination. Normal capillary refill. Normal pulse.     Comments: TTP along 4th metacarpal, no obvious deformity, swelling or bruising. Brisk capillary refill distally.   Skin:    General: Skin is warm and dry.  Neurological:     General: No focal deficit present.     Mental Status: She is alert and oriented to person, place, and time.  Psychiatric:        Mood and Affect: Mood normal.        Behavior: Behavior normal.  Behavior is cooperative.      UC Treatments / Results  Labs (all labs ordered are listed, but only abnormal results are displayed) Labs Reviewed - No data to display  EKG   Radiology DG Hand  Complete Right Result Date: 05/24/2024 CLINICAL DATA:  Right hand pain after pulling boxes last night. EXAM: RIGHT HAND - COMPLETE 3+ VIEW COMPARISON:  Jan 26, 2017. FINDINGS: There is no evidence of fracture or dislocation. There is no evidence of arthropathy or other focal bone abnormality. Soft tissues are unremarkable. IMPRESSION: Negative. Electronically Signed   By: Lynwood Landy Raddle M.D.   On: 05/24/2024 15:22    Procedures Procedures (including critical care time)  Medications Ordered in UC Medications - No data to display  Initial Impression / Assessment and Plan / UC Course  I have reviewed the triage vital signs and the nursing notes.  Pertinent labs & imaging results that were available during my care of the patient were reviewed by me and considered in my medical decision making (see chart for details).  Vitals and triage reviewed, patient is hemodynamically stable.  Right handed fourth metacarpal pain started suddenly at work lifting heavy boxes.  Full range of motion, brisk capillary refill distally.  No obvious deformity or bruising.  Imaging by my interpretation does not show acute bony abnormality, confirmed with radiology over-read.  Will provide brace for support, encouraged anti-inflammatories.  Ortho follow-up as needed.  Plan of care, follow-up care return precautions given, no questions at this time.     Final Clinical Impressions(s) / UC Diagnoses   Final diagnoses:  Right hand pain     Discharge Instructions      Imaging did not reveal any arthritis, fractures or breaks in your bones.  I suspect you strained your hand at work.  Take naproxen  twice daily as needed to help with any pain or inflammation.  Wear the brace throughout the day and at work to help provide support.    If the area continues to give you trouble beyond the next few weeks please follow-up with sports medicine for further advanced evaluation.     ED Prescriptions     Medication Sig  Dispense Auth. Provider   naproxen  (NAPROSYN ) 375 MG tablet Take 1 tablet (375 mg total) by mouth 2 (two) times daily. 20 tablet Dreama, Kipper Buch  N, FNP      PDMP not reviewed this encounter.   Dreama, Jayvier Burgher  N, FNP 05/24/24 1544

## 2024-05-24 NOTE — Discharge Instructions (Addendum)
 Imaging did not reveal any arthritis, fractures or breaks in your bones.  I suspect you strained your hand at work.  Take naproxen  twice daily as needed to help with any pain or inflammation.  Wear the brace throughout the day and at work to help provide support.    If the area continues to give you trouble beyond the next few weeks please follow-up with sports medicine for further advanced evaluation.

## 2024-05-24 NOTE — ED Triage Notes (Signed)
 Patient here today with c/o right hand/wrist pain since last night while at work. Patient states that the pain started while she was pulling boxes. Patient has used some pain relief spray that her coworker gave her with little relief.

## 2024-05-30 ENCOUNTER — Ambulatory Visit (INDEPENDENT_AMBULATORY_CARE_PROVIDER_SITE_OTHER): Admitting: Podiatry

## 2024-05-30 ENCOUNTER — Encounter: Payer: Self-pay | Admitting: Podiatry

## 2024-05-30 ENCOUNTER — Ambulatory Visit

## 2024-05-30 DIAGNOSIS — M7671 Peroneal tendinitis, right leg: Secondary | ICD-10-CM | POA: Diagnosis not present

## 2024-05-30 DIAGNOSIS — M7672 Peroneal tendinitis, left leg: Secondary | ICD-10-CM

## 2024-05-30 NOTE — Patient Instructions (Addendum)
 Peroneal Tendinopathy Rehab Ask your health care provider which exercises are safe for you. Do exercises exactly as told by your health care provider and adjust them as directed. It is normal to feel mild stretching, pulling, tightness, or discomfort as you do these exercises. Stop right away if you feel sudden pain or your pain gets worse. Do not begin these exercises until told by your health care provider. Stretching and range-of-motion exercises These exercises warm up your muscles and joints. They can help improve the movement and flexibility of your ankle. They may also help to relieve pain and stiffness. Gastrocnemius and soleus stretch, standing This is an exercise in which you stand on a step and use your body weight to stretch your calf muscles. To do this exercise: Stand on the edge of a step on the ball of your left / right foot. The ball of your foot is on the walking surface, right under your toes. Keep your other foot firmly on the same step. Hold on to the wall, a railing, or a chair for balance. Slowly lift your other foot, allowing your body weight to press your left / right heel down over the edge of the step. You should feel a stretch in your left / right calf (gastrocnemius and soleus). Hold this position for __________ seconds. Return both feet to the step. Repeat this exercise with a slight bend in your left / right knee. Repeat __________ times with your left / right knee straight and __________ times with your left / right knee bent. Complete this exercise __________ times a day. Strengthening exercises These exercises build strength and endurance in your foot and ankle. Endurance is the ability to use your muscles for a long time, even after they get tired. Ankle dorsiflexion with band  Secure a rubber exercise band or tube to an object, such as a table leg, that will not move when the band is pulled. Secure the other end of the band around your left / right foot. Sit on  the floor. Face the object with your left / right leg extended. The band or tube should be slightly tense when your foot is relaxed. Slowly flex your left / right ankle and toes to bring your foot toward you (dorsiflexion). Hold this position for __________ seconds. Let the band or tube slowly pull your foot back to the starting position. Repeat __________ times. Complete this exercise __________ times a day. Ankle eversion  Sit on the floor with your legs straight out in front of you. Loop a rubber exercise band or tube around the ball of your left / right foot. The ball of your foot is on the walking surface, right under your toes. Hold the ends of the band in your hands. You can also secure the band to a stable object. The band or tube should be slightly tense when your foot is relaxed. Slowly push your foot outward, away from your other leg (eversion). Hold this position for __________ seconds. Slowly return your foot to the starting position. Repeat __________ times. Complete this exercise __________ times a day. Plantar flexion, standing This exercise is sometimes called a standing heel raise. Stand with your feet shoulder-width apart. Place your hands on a wall or table to steady yourself as needed. Try not to use it for support. Keep your weight spread evenly over the width of your feet while you slowly rise up on your toes (plantar flexion). If told by your health care provider: Shift your weight  toward your left / right leg until you feel challenged. Stand on your left / right leg only. Hold this position for __________ seconds. Repeat __________ times. Complete this exercise __________ times a day. Single leg stand  Without shoes, stand near a railing or in a doorway. You may hold on to the railing or doorframe as needed. Stand on your left / right foot. Keep your big toe down on the floor and try to keep your arch lifted. Do not roll to the outside of your foot. If this  exercise is too easy, you can try it with your eyes closed or while standing on a pillow. Hold this position for __________ seconds. Repeat __________ times. Complete this exercise __________ times a day. This information is not intended to replace advice given to you by your health care provider. Make sure you discuss any questions you have with your health care provider. Document Revised: 12/24/2021 Document Reviewed: 12/24/2021 Elsevier Patient Education  2024 [employer].

## 2024-05-30 NOTE — Progress Notes (Signed)
  Subjective:  Patient ID: Jessica Fitzpatrick, female    DOB: 1996-04-21,   MRN: [id number]  Chief Complaint  Patient presents with   Foot Pain    I have constant pain when I apply pressure on my left foot.  It hurts really bad when I get up in the morning.  I rolled my ankle in August.  My foot has been hurting every since then. (Lateral side, plantar heel and arch)    28 y.o. female presents for concern of left ankle pain that occurred over a  month ago. She relates she stepped wrong and rolled her ankle. She has pain. She was seen in urgent care and given a boot and advised to follow-up. Relates also starting to get similar pain to the right ankle on the outside with shooting pain and feeling of giving out.  . Denies any other pedal complaints. Denies n/v/f/c.   Past Medical History:  Diagnosis Date   Asthma Jan 2013   no occurences since then   Headache(784.0)    associated with menses   Hidradenitis axillaris 04/2013    Objective:  Physical Exam: Vascular: DP/PT pulses 2/4 bilateral. CFT <3 seconds. Normal hair growth on digits. No edema.  Skin. No lacerations or abrasions bilateral feet.  Musculoskeletal: MMT 5/5 bilateral lower extremities in DF, PF, Inversion and Eversion. Deceased ROM in DF of ankle joint. Tender to plapation of peroneal tendon around lateral malleolus bilateral worse on left. Tender with DF and eversion bilateral. No pain with PF or inversion. Some pain to medial calcalneal tubercle on the left.  Neurological: Sensation intact to light touch.   Assessment:   1. Peroneal tendinitis of left lower extremity   2. Peroneal tendonitis, right      Plan:  Patient was evaluated and treated and all questions answered. X-rays reviewed and discussed with patient. No acute fractures or dislocations  Reviewed notes from urgent care and imaging.  Discussed  ankle sprain and treatment options at length with patient Discussed stretching exercises and provided  handout. Prescription for meloxicam provided. CMP reviwed kideny function wnl.  Dispensed Tri-Lock ankle brace. Discussed that if the symptoms do not improve can consider PT/MRI. Patient to return in 6 to 8 weeks or sooner if symptoms fail to improve or worsen.   Asberry Failing, DPM

## 2024-06-25 ENCOUNTER — Ambulatory Visit (HOSPITAL_COMMUNITY): Admission: EM | Admit: 2024-06-25 | Discharge: 2024-06-25 | Disposition: A | Discharge status: Home / Self Care

## 2024-06-25 ENCOUNTER — Encounter (HOSPITAL_COMMUNITY): Payer: Self-pay

## 2024-06-25 DIAGNOSIS — R070 Pain in throat: Secondary | ICD-10-CM

## 2024-06-25 DIAGNOSIS — J029 Acute pharyngitis, unspecified: Secondary | ICD-10-CM

## 2024-06-25 LAB — POC SOFIA SARS ANTIGEN FIA: SARS Coronavirus 2 Ag: NEGATIVE

## 2024-06-25 NOTE — ED Triage Notes (Signed)
 Patient c/o a sore throat since yesterday. Patient denies a fever.  Patient has not had any medications for her symptoms.

## 2024-06-25 NOTE — Discharge Instructions (Signed)

## 2024-06-25 NOTE — ED Provider Notes (Signed)
 [name]    [name]: [id number] Arrival date & time: 06/25/24  1801      History   Chief Complaint Chief Complaint  Patient presents with   Sore Throat    HPI Jessica Fitzpatrick is a 28 y.o. female.   Patient presents today for throat pain for 1 day.  Patient denies fever, cough, or known sick contacts.  Patient states that she was at [employer]  homecoming this weekend and she was outside with various people since Thursday. Pt states that she has not taken anything for her symptoms. The last time she felt this way she had covid. Reports that she is experiencing throat pain when she swallows food and drink.   Sore Throat    Past Medical History:  Diagnosis Date   Asthma Jan 2013   no occurences since then   Headache(784.0)    associated with menses   Hidradenitis axillaris 04/2013    Patient Active Problem List   Diagnosis Date Noted   Back pain and neck pain 03/25/2021   Healthcare maintenance 03/25/2021   Fibroids 05/01/2018   Ovarian cyst 05/01/2018   Pelvic pain 05/01/2018   Failed vision screen 05/23/2013   Failed hearing screening 05/23/2013   Body mass index, pediatric, 85th percentile to less than 95th percentile for age 13/06/2013   Hydradenitis 05/09/2013    Past Surgical History:  Procedure Laterality Date   INCISION AND DRAINAGE ABSCESS Bilateral 05/10/2013   Procedure: INCISION AND DRAINAGE of bilateral axillary hydradenitis;  Surgeon: Bernarda Ned, MD;  Location: [facility];  Service: General;  Laterality: Bilateral;    OB History     Gravida  0   Para  0   Term  0   Preterm  0   AB  0   Living  0      SAB  0   IAB  0   Ectopic  0   Multiple  0   Live Births  0            Home Medications    Prior to Admission medications   Medication Sig Start Date End Date Taking? Authorizing Provider  albuterol  (VENTOLIN  HFA) 108 (90 Base) MCG/ACT inhaler Inhale 2 puffs into the lungs every 6 (six) hours as needed for wheezing  or shortness of breath. 03/25/21   Ariwodo, Shirley, MD  diphenhydrAMINE  (BENADRYL ) 25 MG tablet Take 1 tablet (25 mg total) by mouth every 6 (six) hours as needed for up to 7 days. 04/10/24 05/30/24  Soto, Johana, PA-C  fluticasone  (FLOVENT  HFA) 110 MCG/ACT inhaler Inhale 1 puff into the lungs in the morning and at bedtime for 7 days. 09/13/22 05/30/24  Mayer, Jodi R, NP  naproxen  (NAPROSYN ) 375 MG tablet Take 1 tablet (375 mg total) by mouth 2 (two) times daily. 05/24/24   Dreama, [place]  N, FNP  triamcinolone  cream (KENALOG ) 0.1 % Apply 1 Application topically 2 (two) times daily. 03/28/23   Enedelia Dorna HERO, FNP    Family History Family History  Problem Relation Age of Onset   Cancer Maternal Aunt        Breast. diagnosed young   Cancer Maternal Grandmother        Breast   Lupus Brother     Social History Social History   Tobacco Use   Smoking status: Every Day    Types: E-cigarettes   Smokeless tobacco: Never   Tobacco comments:    5 cigs/d  Vaping Use   Vaping status:  Every Day  Substance Use Topics   Alcohol use: Yes    Alcohol/week: 5.0 standard drinks of alcohol    Types: 5 Shots of liquor per week    Comment: Occasionally   Drug use: Yes    Frequency: 3.0 times per week    Types: Marijuana     Allergies   Ibuprofen    Review of Systems Review of Systems   Physical Exam Triage Vital Signs ED Triage Vitals  Encounter Vitals Group     BP 06/25/24 1842 124/85     Girls Systolic BP Percentile --      Girls Diastolic BP Percentile --      Boys Systolic BP Percentile --      Boys Diastolic BP Percentile --      Pulse Rate 06/25/24 1842 96     Resp 06/25/24 1842 16     Temp 06/25/24 1842 98.4 F (36.9 C)     Temp Source 06/25/24 1842 Oral     SpO2 06/25/24 1842 98 %     Weight --      Height --      Head Circumference --      Peak Flow --      Pain Score 06/25/24 1841 9     Pain Loc --      Pain Education --      Exclude from Growth Chart --     No data found.  Updated Vital Signs BP 124/85 (BP Location: Left Arm)   Pulse 96   Temp 98.4 F (36.9 C) (Oral)   Resp 16   LMP 06/06/2024 (Exact Date)   SpO2 98%   Visual Acuity Right Eye Distance:   Left Eye Distance:   Bilateral Distance:    Right Eye Near:   Left Eye Near:    Bilateral Near:     Physical Exam Vitals and nursing note reviewed.  Constitutional:      General: She is not in acute distress.    Appearance: Normal appearance. She is not ill-appearing, toxic-appearing or diaphoretic.  HENT:     Mouth/Throat:     Mouth: Mucous membranes are moist.     Pharynx: Oropharynx is clear. No oropharyngeal exudate or posterior oropharyngeal erythema.  Eyes:     General: No scleral icterus. Cardiovascular:     Rate and Rhythm: Normal rate and regular rhythm.     Heart sounds: Normal heart sounds.  Pulmonary:     Effort: Pulmonary effort is normal. No respiratory distress.     Breath sounds: Normal breath sounds. No wheezing or rhonchi.  Skin:    General: Skin is warm.  Neurological:     Mental Status: She is alert and oriented to person, place, and time.  Psychiatric:        Mood and Affect: Mood normal.        Behavior: Behavior normal.      UC Treatments / Results  Labs (all labs ordered are listed, but only abnormal results are displayed) Labs Reviewed  POC SOFIA SARS ANTIGEN FIA    EKG   Radiology No results found.  Procedures Procedures (including critical care time)  Medications Ordered in UC Medications - No data to display  Initial Impression / Assessment and Plan / UC Course  I have reviewed the triage vital signs and the nursing notes.  Pertinent labs & imaging results that were available during my care of the patient were reviewed by me and considered in my medical decision  making (see chart for details).     Acute pharyngitis-patient was provided with instructions for supportive treatment.  Final Clinical Impressions(s) / UC  Diagnoses   Final diagnoses:  Throat pain   Discharge Instructions   None    ED Prescriptions   None    PDMP not reviewed this encounter.   Andra Corean BROCKS, PA-C 06/25/24 1958

## 2024-07-11 ENCOUNTER — Encounter (HOSPITAL_COMMUNITY): Payer: Self-pay

## 2024-07-11 ENCOUNTER — Other Ambulatory Visit: Payer: Self-pay

## 2024-07-11 ENCOUNTER — Ambulatory Visit (INDEPENDENT_AMBULATORY_CARE_PROVIDER_SITE_OTHER): Admitting: Podiatry

## 2024-07-11 ENCOUNTER — Emergency Department (HOSPITAL_COMMUNITY): Admission: EM | Admit: 2024-07-11 | Discharge: 2024-07-11 | Disposition: A | Discharge status: Home / Self Care

## 2024-07-11 DIAGNOSIS — M722 Plantar fascial fibromatosis: Secondary | ICD-10-CM | POA: Insufficient documentation

## 2024-07-11 DIAGNOSIS — Z91199 Patient's noncompliance with other medical treatment and regimen due to unspecified reason: Secondary | ICD-10-CM

## 2024-07-11 NOTE — Progress Notes (Signed)
 No show

## 2024-07-11 NOTE — ED Provider Notes (Signed)
 [place] EMERGENCY DEPARTMENT AT [facility] Provider Note   [name]: [id number] Arrival date & time: 07/11/24  1545     Patient presents with: Ankle Pain   Jessica Fitzpatrick is a 28 y.o. female.   Patient to ED with bilateral foot pain that goes from the distal heel up the back of the foot. Issues started several months ago. She is being seen and treated by [facility]. She states the pain is worse in the mornings, better through the day and recur again the next day. She is taking Tylenol  and NSAIDs without significant relief.   The history is provided by the patient. No language interpreter was used.  Ankle Pain      Prior to Admission medications   Medication Sig Start Date End Date Taking? Authorizing Provider  albuterol  (VENTOLIN  HFA) 108 (90 Base) MCG/ACT inhaler Inhale 2 puffs into the lungs every 6 (six) hours as needed for wheezing or shortness of breath. 03/25/21   Ariwodo, Shirley I, MD  diphenhydrAMINE  (BENADRYL ) 25 MG tablet Take 1 tablet (25 mg total) by mouth every 6 (six) hours as needed for up to 7 days. 04/10/24 05/30/24  Soto, Johana, PA-C  fluticasone  (FLOVENT  HFA) 110 MCG/ACT inhaler Inhale 1 puff into the lungs in the morning and at bedtime for 7 days. 09/13/22 05/30/24  Mayer, Jodi R, NP  naproxen  (NAPROSYN ) 375 MG tablet Take 1 tablet (375 mg total) by mouth 2 (two) times daily. 05/24/24   Dreama, [place]  N, FNP  triamcinolone  cream (KENALOG ) 0.1 % Apply 1 Application topically 2 (two) times daily. 03/28/23   Enedelia Dorna HERO, FNP    Allergies: Ibuprofen     Review of Systems  Updated Vital Signs BP (!) 132/93 (BP Location: Right Arm)   Pulse 93   Temp 98.1 F (36.7 C) (Oral)   Resp 15   Ht 5' 4 (1.626 m)   Wt 74.8 kg   LMP 07/06/2024 (Exact Date)   SpO2 100%   BMI 28.31 kg/m   Physical Exam Constitutional:      General: She is not in acute distress.    Appearance: She is well-developed. She is not ill-appearing.  Pulmonary:      Effort: Pulmonary effort is normal.  Musculoskeletal:        General: Normal range of motion.     Cervical back: Normal range of motion.     Comments: No swelling of feet or ankles. No erythema. Full joint strength.  Skin:    General: Skin is warm and dry.  Neurological:     Mental Status: She is alert and oriented to person, place, and time.     (all labs ordered are listed, but only abnormal results are displayed) Labs Reviewed - No data to display  EKG: None  Radiology: No results found.   Procedures   Medications Ordered in the ED - No data to display  Clinical Course as of 07/11/24 1648  Wed Jul 11, 2024  1646 Patient with ongoing foot and ankle pain x months. Has been under the care of podiatry. No new symptoms. Exam is unremarkable without sign of acute injury, injection or vascular compromise. She is encouraged to continue her care with podiatry. [SU]    Clinical Course User Index [SU] Odell Balls, PA-C                                 Medical Decision Making  Final diagnoses:  Plantar fascial fibromatosis of both feet    ED Discharge Orders     None          Odell Balls, PA-C 07/11/24 1648    Kammerer, Megan L, DO 07/19/24 [phone]

## 2024-07-11 NOTE — ED Triage Notes (Signed)
 Pt arrives via POV. PT reports pain and mild swelling in both ankles for the past couple of months. Denies any injury. Pt AxOx4.

## 2024-07-11 NOTE — Discharge Instructions (Signed)
 As we discussed, follow up as scheduled with [facility] for further management of ongoing foot pain.

## 2024-07-17 ENCOUNTER — Ambulatory Visit: Admitting: Podiatry

## 2024-07-17 DIAGNOSIS — Z91199 Patient's noncompliance with other medical treatment and regimen due to unspecified reason: Secondary | ICD-10-CM

## 2024-07-17 NOTE — Progress Notes (Signed)
 No show

## 2024-08-24 ENCOUNTER — Encounter (HOSPITAL_COMMUNITY): Payer: Self-pay

## 2024-08-24 ENCOUNTER — Ambulatory Visit (HOSPITAL_COMMUNITY)
Admission: EM | Admit: 2024-08-24 | Discharge: 2024-08-24 | Disposition: A | Discharge status: Home / Self Care | Attending: Physician Assistant | Admitting: Physician Assistant

## 2024-08-24 ENCOUNTER — Telehealth (HOSPITAL_COMMUNITY): Payer: Self-pay | Admitting: *Deleted

## 2024-08-24 DIAGNOSIS — J4541 Moderate persistent asthma with (acute) exacerbation: Secondary | ICD-10-CM

## 2024-08-24 DIAGNOSIS — J069 Acute upper respiratory infection, unspecified: Secondary | ICD-10-CM | POA: Diagnosis not present

## 2024-08-24 LAB — POC COVID19/FLU A&B COMBO
Covid Antigen, POC: NEGATIVE
Influenza A Antigen, POC: NEGATIVE
Influenza B Antigen, POC: NEGATIVE

## 2024-08-24 MED ORDER — PROMETHAZINE-DM 6.25-15 MG/5ML PO SYRP: 5.0000 mL | ORAL_SOLUTION | Freq: Four times a day (QID) | ORAL | 0 refills | Status: DC | PRN

## 2024-08-24 MED ORDER — IPRATROPIUM-ALBUTEROL 0.5-2.5 (3) MG/3ML IN SOLN
3.0000 mL | Freq: Once | RESPIRATORY_TRACT | Status: AC
  Administered 2024-08-24: 3 mL via RESPIRATORY_TRACT

## 2024-08-24 MED ORDER — ALBUTEROL SULFATE HFA 108 (90 BASE) MCG/ACT IN AERS: 2.0000 | INHALATION_SPRAY | Freq: Four times a day (QID) | RESPIRATORY_TRACT | 0 refills | Status: DC | PRN

## 2024-08-24 MED ORDER — BUDESONIDE-FORMOTEROL FUMARATE 80-4.5 MCG/ACT IN AERO: 2.0000 | INHALATION_SPRAY | Freq: Two times a day (BID) | RESPIRATORY_TRACT | 12 refills | Status: AC | PRN

## 2024-08-24 MED ORDER — PREDNISONE 20 MG PO TABS: 40.0000 mg | ORAL_TABLET | Freq: Every day | ORAL | 0 refills | Status: AC

## 2024-08-24 MED ORDER — BUDESONIDE-FORMOTEROL FUMARATE 80-4.5 MCG/ACT IN AERO: 2.0000 | INHALATION_SPRAY | Freq: Two times a day (BID) | RESPIRATORY_TRACT | 12 refills | Status: DC | PRN

## 2024-08-24 MED ORDER — METHYLPREDNISOLONE SODIUM SUCC 125 MG IJ SOLR
60.0000 mg | Freq: Once | INTRAMUSCULAR | Status: AC
  Administered 2024-08-24: 60 mg via INTRAMUSCULAR

## 2024-08-24 MED ORDER — IPRATROPIUM-ALBUTEROL 0.5-2.5 (3) MG/3ML IN SOLN
RESPIRATORY_TRACT | Status: AC
  Filled 2024-08-24: qty 3

## 2024-08-24 MED ORDER — ALBUTEROL SULFATE HFA 108 (90 BASE) MCG/ACT IN AERS: 2.0000 | INHALATION_SPRAY | Freq: Four times a day (QID) | RESPIRATORY_TRACT | 0 refills | Status: AC | PRN

## 2024-08-24 MED ORDER — METHYLPREDNISOLONE SODIUM SUCC 125 MG IJ SOLR
INTRAMUSCULAR | Status: AC
  Filled 2024-08-24: qty 2

## 2024-08-24 NOTE — Discharge Instructions (Addendum)
 You tested negative for COVID and flu.  I suspect you have a different virus that is triggered your asthma.  Use albuterol  every 4-6 hours as needed.  Start Symbicort twice a day.  Rinse your mouth following use of this medication to prevent oral thrush.  We gave you an injection of steroids today.  Please start prednisone  tomorrow (08/25/2024).  Do not take NSAIDs with this medication including aspirin, ibuprofen /Advil , naproxen /Aleve .  You can use Tylenol , Mucinex, Flonase , nasal saline/sinus rinses for additional symptom relief.  Take Promethazine  DM for cough.  This will make you sleepy so do not drive or drink alcohol taking it.  If you are not feeling better within 3 to 5 days please return for reevaluation.  If anything worsens and you have high fever, worsening cough, shortness of breath, chest pain you need to be seen immediately.

## 2024-08-24 NOTE — Telephone Encounter (Signed)
 Pt called states pharmacy does not have Symbicort or albuterol , called pharmacy they do not have rx for either. Resent pt aware

## 2024-08-24 NOTE — ED Triage Notes (Signed)
 Pt states cough,headache,congestion,runny nose,and sore throat for the past 3 days.  States last night took nyquil and mucous medicine at home with no relief.

## 2024-08-24 NOTE — ED Provider Notes (Signed)
 [name]    [name]: [id number] Arrival date & time: 08/24/24  1707      History   Chief Complaint Chief Complaint  Patient presents with   Cough    HPI Jessica Fitzpatrick is a 28 y.o. female.   Patient presents today with a several day history of URI symptoms.  Reports cough, headache, congestion, rhinorrhea, sore throat, subjective fever, body aches.  Reports shortness of breath and chest tightness but no chest pain.  Denies any known sick contacts.  Has been taking DayQuil and Mucinex over-the-counter without improvement of symptoms.  She does have a history of asthma but does not have any of her medication available.  Denies previous hospitalization related to asthma.  Denies any recent antibiotics or steroids.  She has had COVID several years ago but not more recently.  She is eating and drinking despite the symptoms.    Past Medical History:  Diagnosis Date   Asthma Jan 2013   no occurences since then   Headache(784.0)    associated with menses   Hidradenitis axillaris 04/2013    Patient Active Problem List   Diagnosis Date Noted   Back pain and neck pain 03/25/2021   Healthcare maintenance 03/25/2021   Fibroids 05/01/2018   Ovarian cyst 05/01/2018   Pelvic pain 05/01/2018   Failed vision screen 05/23/2013   Failed hearing screening 05/23/2013   Body mass index, pediatric, 85th percentile to less than 95th percentile for age 14/06/2013   Hydradenitis 05/09/2013    Past Surgical History:  Procedure Laterality Date   INCISION AND DRAINAGE ABSCESS Bilateral 05/10/2013   Procedure: INCISION AND DRAINAGE of bilateral axillary hydradenitis;  Surgeon: Bernarda Ned, MD;  Location: [facility];  Service: General;  Laterality: Bilateral;    OB History     Gravida  0   Para  0   Term  0   Preterm  0   AB  0   Living  0      SAB  0   IAB  0   Ectopic  0   Multiple  0   Live Births  0            Home Medications    Prior to Admission  medications  Medication Sig Start Date End Date Taking? Authorizing Provider  budesonide-formoterol (SYMBICORT) 80-4.5 MCG/ACT inhaler Inhale 2 puffs into the lungs 2 (two) times daily as needed. 08/24/24  Yes Shalane Florendo K, PA-C  predniSONE  (DELTASONE ) 20 MG tablet Take 2 tablets (40 mg total) by mouth daily for 5 days. 08/24/24 08/29/24 Yes Rayen Palen K, PA-C  promethazine -dextromethorphan (PROMETHAZINE -DM) 6.25-15 MG/5ML syrup Take 5 mLs by mouth 4 (four) times daily as needed for cough. 08/24/24  Yes Carleton Vanvalkenburgh, Rocky POUR, PA-C  albuterol  (VENTOLIN  HFA) 108 (90 Base) MCG/ACT inhaler Inhale 2 puffs into the lungs every 6 (six) hours as needed for wheezing or shortness of breath. 08/24/24   Trafton Roker K, PA-C  diphenhydrAMINE  (BENADRYL ) 25 MG tablet Take 1 tablet (25 mg total) by mouth every 6 (six) hours as needed for up to 7 days. 04/10/24 05/30/24  Soto, Johana, PA-C  triamcinolone  cream (KENALOG ) 0.1 % Apply 1 Application topically 2 (two) times daily. 03/28/23   Enedelia Dorna HERO, FNP    Family History Family History  Problem Relation Age of Onset   Cancer Maternal Aunt        Breast. diagnosed young   Cancer Maternal Grandmother  Breast   Lupus Brother     Social History Social History[1]   Allergies   Ibuprofen    Review of Systems Review of Systems  Constitutional:  Positive for activity change, fatigue and fever. Negative for appetite change.  HENT:  Positive for congestion, postnasal drip and sore throat. Negative for sinus pressure and sneezing.   Respiratory:  Positive for cough, chest tightness, shortness of breath and wheezing.   Cardiovascular:  Negative for chest pain.  Gastrointestinal:  Positive for diarrhea. Negative for nausea and vomiting.  Musculoskeletal:  Positive for arthralgias and myalgias.  Neurological:  Positive for headaches. Negative for dizziness and light-headedness.     Physical Exam Triage Vital Signs ED Triage Vitals  Encounter  Vitals Group     BP 08/24/24 1720 121/76     Girls Systolic BP Percentile --      Girls Diastolic BP Percentile --      Boys Systolic BP Percentile --      Boys Diastolic BP Percentile --      Pulse Rate 08/24/24 1720 88     Resp 08/24/24 1720 16     Temp 08/24/24 1720 98.1 F (36.7 C)     Temp Source 08/24/24 1720 Oral     SpO2 08/24/24 1720 98 %     Weight --      Height --      Head Circumference --      Peak Flow --      Pain Score 08/24/24 1719 0     Pain Loc --      Pain Education --      Exclude from Growth Chart --    No data found.  Updated Vital Signs BP 121/76 (BP Location: Left Arm)   Pulse 88   Temp 98.1 F (36.7 C) (Oral)   Resp 16   LMP 08/06/2024 (Approximate)   SpO2 98%   Visual Acuity Right Eye Distance:   Left Eye Distance:   Bilateral Distance:    Right Eye Near:   Left Eye Near:    Bilateral Near:     Physical Exam Vitals reviewed.  Constitutional:      General: She is awake. She is not in acute distress.    Appearance: Normal appearance. She is well-developed. She is not ill-appearing.     Comments: Very pleasant female appears stated age in no acute distress sitting comfortably in exam room  HENT:     Head: Normocephalic and atraumatic.     Right Ear: Tympanic membrane, ear canal and external ear normal. Tympanic membrane is not erythematous or bulging.     Left Ear: Tympanic membrane, ear canal and external ear normal. Tympanic membrane is not erythematous or bulging.     Nose:     Right Sinus: No maxillary sinus tenderness or frontal sinus tenderness.     Left Sinus: No maxillary sinus tenderness or frontal sinus tenderness.     Mouth/Throat:     Pharynx: Uvula midline. Postnasal drip present. No oropharyngeal exudate or posterior oropharyngeal erythema.  Cardiovascular:     Rate and Rhythm: Normal rate and regular rhythm.     Heart sounds: Normal heart sounds, S1 normal and S2 normal. No murmur heard. Pulmonary:     Effort:  Pulmonary effort is normal.     Breath sounds: Wheezing present. No rhonchi or rales.     Comments: Scattered wheezing that resolved following DuoNeb in clinic Psychiatric:        Behavior: Behavior  is cooperative.      UC Treatments / Results  Labs (all labs ordered are listed, but only abnormal results are displayed) Labs Reviewed  POC COVID19/FLU A&B COMBO    EKG   Radiology No results found.  Procedures Procedures (including critical care time)  Medications Ordered in UC Medications  ipratropium-albuterol  (DUONEB) 0.5-2.5 (3) MG/3ML nebulizer solution 3 mL (3 mLs Nebulization Given 08/24/24 1736)  methylPREDNISolone  sodium succinate (SOLU-MEDROL ) 125 mg/2 mL injection 60 mg (60 mg Intramuscular Given 08/24/24 1736)    Initial Impression / Assessment and Plan / UC Course  I have reviewed the triage vital signs and the nursing notes.  Pertinent labs & imaging results that were available during my care of the patient were reviewed by me and considered in my medical decision making (see chart for details).     Patient is well-appearing, afebrile, nontoxic, nontachycardic.  Concern for viral illness that has triggered her asthma given clinical presentation.  No evidence of acute infection on physical exam that would warrant initiation of antibiotics.  COVID and flu testing was negative in clinic.  She was given a DuoNeb and 60 mg of Solu-Medrol  in clinic with improvement of symptoms.  Refill of albuterol  was sent to pharmacy with instruction to use this every 4-6 hours as needed will also start Symbicort.  We discussed that she is to rinse her mouth following use of Symbicort to prevent thrush.  Will start prednisone  burst of 40 mg for 5 days tomorrow (08/25/2024) with instructions to take NSAIDs with this medication and risk of GI bleeding but she can use over-the-counter medications as needed.  She was given Promethazine  DM for cough and we discussed that this can be sedating  and she is not to drive or drink alcohol with taking it.  Chest x-ray was deferred as her adventitious lung sounds resolved following albuterol /ipratropium in clinic and her oxygen saturation was 98%.  We did discuss that if she is not feeling better within 3 to 5 days she should return for reevaluation at which point we would reconsider chest x-ray.  We discussed that if anything worsens and she has chest pain, shortness of breath, chest tightness, weakness, high fever, worsening cough she is to be seen emergently.  Return precautions given.  Excuse note provided.  Final Clinical Impressions(s) / UC Diagnoses   Final diagnoses:  Viral URI with cough  Moderate persistent asthma with acute exacerbation     Discharge Instructions      You tested negative for COVID and flu.  I suspect you have a different virus that is triggered your asthma.  Use albuterol  every 4-6 hours as needed.  Start Symbicort twice a day.  Rinse your mouth following use of this medication to prevent oral thrush.  We gave you an injection of steroids today.  Please start prednisone  tomorrow (08/25/2024).  Do not take NSAIDs with this medication including aspirin, ibuprofen /Advil , naproxen /Aleve .  You can use Tylenol , Mucinex, Flonase , nasal saline/sinus rinses for additional symptom relief.  Take Promethazine  DM for cough.  This will make you sleepy so do not drive or drink alcohol taking it.  If you are not feeling better within 3 to 5 days please return for reevaluation.  If anything worsens and you have high fever, worsening cough, shortness of breath, chest pain you need to be seen immediately.     ED Prescriptions     Medication Sig Dispense Auth. Provider   albuterol  (VENTOLIN  HFA) 108 (90 Base) MCG/ACT inhaler Inhale  2 puffs into the lungs every 6 (six) hours as needed for wheezing or shortness of breath. 18 g Star Cheese K, PA-C   budesonide-formoterol (SYMBICORT) 80-4.5 MCG/ACT inhaler Inhale 2 puffs into the lungs  2 (two) times daily as needed. 1 each Mekel Haverstock K, PA-C   predniSONE  (DELTASONE ) 20 MG tablet Take 2 tablets (40 mg total) by mouth daily for 5 days. 10 tablet Garey Alleva K, PA-C   promethazine -dextromethorphan (PROMETHAZINE -DM) 6.25-15 MG/5ML syrup Take 5 mLs by mouth 4 (four) times daily as needed for cough. 118 mL Ngoc Detjen K, PA-C      PDMP not reviewed this encounter.     [1]  Social History Tobacco Use   Smoking status: Every Day    Types: E-cigarettes   Smokeless tobacco: Never   Tobacco comments:    5 cigs/d  Vaping Use   Vaping status: Every Day  Substance Use Topics   Alcohol use: Yes    Alcohol/week: 5.0 standard drinks of alcohol    Types: 5 Shots of liquor per week    Comment: Occasionally   Drug use: Yes    Frequency: 3.0 times per week    Types: Marijuana     Sherrell Rocky POUR, PA-C 08/24/24 1811

## 2024-09-26 ENCOUNTER — Ambulatory Visit (HOSPITAL_COMMUNITY)
Admission: EM | Admit: 2024-09-26 | Discharge: 2024-09-26 | Disposition: A | Discharge status: Home / Self Care | Payer: Self-pay

## 2024-09-26 ENCOUNTER — Encounter (HOSPITAL_COMMUNITY): Payer: Self-pay

## 2024-09-26 DIAGNOSIS — R202 Paresthesia of skin: Secondary | ICD-10-CM

## 2024-09-26 MED ORDER — PREDNISONE 20 MG PO TABS: 40.0000 mg | ORAL_TABLET | Freq: Every day | ORAL | 0 refills | Status: AC

## 2024-09-26 NOTE — ED Triage Notes (Signed)
 Pt c/o rt hand/fingers numbness x1 wk and pain x4 days. C/o pain to lt hand. Denies injury. States at work she uses her hands and fingers a lot. States taken Ibuprofen  with relief.

## 2024-09-26 NOTE — Discharge Instructions (Addendum)
" °  1. Paresthesia of hand (Primary) - Apply Wrist brace to right wrist for decreased mobility and protection until symptoms improve or follow-up with orthopedics - predniSONE  (DELTASONE ) 20 MG tablet; Take 2 tablets (40 mg total) by mouth daily for 5 days.  Dispense: 10 tablet; Refill: 0 - AMB referral to orthopedics for further evaluation of suspected carpal tunnel with numbness and tingling to right hand.  -Continue to monitor symptoms for any change in severity if there is any escalation of current symptoms or development of new symptoms follow-up in ER for further evaluation and management. "

## 2024-09-26 NOTE — ED Provider Notes (Signed)
 " [facility] [place]  Note:  This document was prepared using Dragon voice recognition software and may include unintentional dictation errors.  MRN: [id number] DOB: [birth year]  Subjective:   Jessica Fitzpatrick is a 29 y.o. female presenting for numbness and tingling to the hand/fingers of the right hand hand x 1 week and moderate pain to the left hand x 4 days.  Patient states that she denies any known injury or trauma.  Has not had any past history of paresthesia to the hands.  Patient states that her work she has to use her hands a lot and was concerned that it could have been caused by work tasks.  Has been taking ibuprofen  with minimal improvement.  Current Medications[1]   Allergies[2]  Past Medical History:  Diagnosis Date   Asthma Jan 2013   no occurences since then   Headache(784.0)    associated with menses   Hidradenitis axillaris 04/2013     Past Surgical History:  Procedure Laterality Date   INCISION AND DRAINAGE ABSCESS Bilateral 05/10/2013   Procedure: INCISION AND DRAINAGE of bilateral axillary hydradenitis;  Surgeon: Bernarda Ned, MD;  Location: [facility];  Service: General;  Laterality: Bilateral;    Family History  Problem Relation Age of Onset   Cancer Maternal Aunt        Breast. diagnosed young   Cancer Maternal Grandmother        Breast   Lupus Brother     Social History[3]  ROS Refer to HPI for ROS details.  Objective:    Vitals: BP (!) 145/91 (BP Location: Left Arm)   Pulse 79   Temp 98.6 F (37 C) (Oral)   Resp 16   LMP 08/28/2024 (Approximate)   SpO2 98%   Physical Exam Vitals and nursing note reviewed.  Constitutional:      General: She is not in acute distress.    Appearance: She is well-developed. She is not ill-appearing or toxic-appearing.  HENT:     Head: Normocephalic and atraumatic.  Cardiovascular:     Rate and Rhythm: Normal rate.  Pulmonary:     Effort: Pulmonary effort is normal. No respiratory distress.      Breath sounds: No stridor. No wheezing.  Musculoskeletal:     Right hand: Tenderness present. No swelling, deformity or bony tenderness. Normal range of motion. Decreased strength. Decreased sensation. Normal capillary refill. Normal pulse.     Left hand: Tenderness present. No swelling, deformity or bony tenderness. Normal strength. Normal sensation. Normal capillary refill. Normal pulse.  Skin:    General: Skin is warm and dry.  Neurological:     General: No focal deficit present.     Mental Status: She is alert and oriented to person, place, and time.  Psychiatric:        Mood and Affect: Mood normal.        Behavior: Behavior normal.     Procedures  No results found for this or any previous visit (from the past 24 hours).  Assessment and Plan :     Discharge Instructions       1. Paresthesia of hand (Primary) - Apply Wrist brace to right wrist for decreased mobility and protection until symptoms improve or follow-up with orthopedics - predniSONE  (DELTASONE ) 20 MG tablet; Take 2 tablets (40 mg total) by mouth daily for 5 days.  Dispense: 10 tablet; Refill: 0 - AMB referral to orthopedics for further evaluation of suspected carpal tunnel with numbness and tingling to right hand.  -  Continue to monitor symptoms for any change in severity if there is any escalation of current symptoms or development of new symptoms follow-up in ER for further evaluation and management.      Elina Streng B Shabnam Ladd    [1] No current facility-administered medications for this encounter.  Current Outpatient Medications:    predniSONE  (DELTASONE ) 20 MG tablet, Take 2 tablets (40 mg total) by mouth daily for 5 days., Disp: 10 tablet, Rfl: 0   albuterol  (VENTOLIN  HFA) 108 (90 Base) MCG/ACT inhaler, Inhale 2 puffs into the lungs every 6 (six) hours as needed for wheezing or shortness of breath., Disp: 18 g, Rfl: 0   budesonide -formoterol  (SYMBICORT ) 80-4.5 MCG/ACT inhaler, Inhale 2 puffs into the  lungs 2 (two) times daily as needed., Disp: 1 each, Rfl: 12   diphenhydrAMINE  (BENADRYL ) 25 MG tablet, Take 1 tablet (25 mg total) by mouth every 6 (six) hours as needed for up to 7 days., Disp: 30 tablet, Rfl: 0   triamcinolone  cream (KENALOG ) 0.1 %, Apply 1 Application topically 2 (two) times daily., Disp: 30 g, Rfl: 0 [2]  Allergies Allergen Reactions   Ibuprofen  Hives and Itching  [3]  Social History Tobacco Use   Smoking status: Every Day    Types: E-cigarettes   Smokeless tobacco: Never   Tobacco comments:    5 cigs/d  Vaping Use   Vaping status: Every Day  Substance Use Topics   Alcohol use: Yes    Alcohol/week: 5.0 standard drinks of alcohol    Types: 5 Shots of liquor per week    Comment: Occasionally   Drug use: Yes    Frequency: 3.0 times per week    Types: Marijuana     Aurea Goodell B, NP 09/26/24 2018  "

## 2024-10-01 ENCOUNTER — Encounter (HOSPITAL_COMMUNITY): Payer: Self-pay

## 2024-10-01 ENCOUNTER — Ambulatory Visit (HOSPITAL_COMMUNITY)
Admission: EM | Admit: 2024-10-01 | Discharge: 2024-10-01 | Disposition: A | Discharge status: Home / Self Care | Payer: Self-pay | Source: Home / Self Care

## 2024-10-01 DIAGNOSIS — R519 Headache, unspecified: Secondary | ICD-10-CM

## 2024-10-01 DIAGNOSIS — B349 Viral infection, unspecified: Secondary | ICD-10-CM

## 2024-10-01 DIAGNOSIS — R051 Acute cough: Secondary | ICD-10-CM

## 2024-10-01 LAB — POCT INFLUENZA A/B
Influenza A, POC: NEGATIVE
Influenza B, POC: NEGATIVE

## 2024-10-01 LAB — POC SOFIA SARS ANTIGEN FIA: SARS Coronavirus 2 Ag: NEGATIVE

## 2024-10-01 MED ORDER — METOCLOPRAMIDE HCL 5 MG/ML IJ SOLN
5.0000 mg | Freq: Once | INTRAMUSCULAR | Status: AC
  Administered 2024-10-01: 5 mg via INTRAMUSCULAR

## 2024-10-01 MED ORDER — METOCLOPRAMIDE HCL 5 MG/ML IJ SOLN
INTRAMUSCULAR | Status: AC
  Filled 2024-10-01: qty 2

## 2024-10-01 MED ORDER — ACETAMINOPHEN 325 MG PO TABS
ORAL_TABLET | ORAL | Status: AC
  Filled 2024-10-01: qty 2

## 2024-10-01 MED ORDER — DEXAMETHASONE SOD PHOSPHATE PF 10 MG/ML IJ SOLN
INTRAMUSCULAR | Status: AC
  Filled 2024-10-01: qty 1

## 2024-10-01 MED ORDER — ACETAMINOPHEN 325 MG PO TABS
650.0000 mg | ORAL_TABLET | Freq: Once | ORAL | Status: AC
  Administered 2024-10-01: 650 mg via ORAL

## 2024-10-01 MED ORDER — DEXAMETHASONE SOD PHOSPHATE PF 10 MG/ML IJ SOLN
10.0000 mg | Freq: Once | INTRAMUSCULAR | Status: AC
  Administered 2024-10-01: 10 mg via INTRAMUSCULAR

## 2024-10-01 NOTE — ED Triage Notes (Signed)
 Pt c/o headache and slight cough x2 days. States taken tylenol  with no relief.

## 2024-10-01 NOTE — Discharge Instructions (Addendum)
 Your COVID and flu swabs are negative.   You were treated for headache today.  You received dexamethasone , Reglan , Tylenol  in clinic.  For headache at home, take Tylenol  as needed. Apply ice pack to head or back of neck, on for 20 minutes and off for 20 minutes for headache.  If you develop new or worsening symptoms or if your symptoms do not start to improve, return here or follow-up with your primary care provider.  If your symptoms are severe, please go to the emergency room.  If needed, follow-up with neurology.  Information below: [facility] [address]  [address] [place], [place]  [address] [phone]

## 2024-10-01 NOTE — ED Provider Notes (Signed)
 " [name]    [name]: [id number] Arrival date & time: 10/01/24  1928      History   Chief Complaint Chief Complaint  Patient presents with   Headache    HPI Jessica Fitzpatrick is a 29 y.o. female.   This 29 year old female is being seen for complaints of headache, cough for for 2 days.  She reports taking Tylenol  without symptom relief.  She endorses photophobia, nasal congestion, sore throat, nausea.  She denies ear pain, chest pain, shortness of breath, abdominal pain, vomiting, diarrhea.  She reports history of headaches, but has not had one recently.   Headache Associated symptoms: congestion, cough, nausea, photophobia and sore throat   Associated symptoms: no abdominal pain, no diarrhea, no dizziness, no ear pain, no fever and no vomiting     Past Medical History:  Diagnosis Date   Asthma Jan 2013   no occurences since then   Headache(784.0)    associated with menses   Hidradenitis axillaris 04/2013    Patient Active Problem List   Diagnosis Date Noted   Back pain and neck pain 03/25/2021   Healthcare maintenance 03/25/2021   Fibroids 05/01/2018   Ovarian cyst 05/01/2018   Pelvic pain 05/01/2018   Failed vision screen 05/23/2013   Failed hearing screening 05/23/2013   Body mass index, pediatric, 85th percentile to less than 95th percentile for age 52/06/2013   Hydradenitis 05/09/2013    Past Surgical History:  Procedure Laterality Date   INCISION AND DRAINAGE ABSCESS Bilateral 05/10/2013   Procedure: INCISION AND DRAINAGE of bilateral axillary hydradenitis;  Surgeon: Bernarda Ned, MD;  Location: [facility];  Service: General;  Laterality: Bilateral;    OB History     Gravida  0   Para  0   Term  0   Preterm  0   AB  0   Living  0      SAB  0   IAB  0   Ectopic  0   Multiple  0   Live Births  0            Home Medications    Prior to Admission medications  Medication Sig Start Date End Date Taking? Authorizing Provider   albuterol  (VENTOLIN  HFA) 108 (90 Base) MCG/ACT inhaler Inhale 2 puffs into the lungs every 6 (six) hours as needed for wheezing or shortness of breath. 08/24/24   Raspet, Erin K, PA-C  budesonide -formoterol  (SYMBICORT ) 80-4.5 MCG/ACT inhaler Inhale 2 puffs into the lungs 2 (two) times daily as needed. 08/24/24   Raspet, Erin K, PA-C  diphenhydrAMINE  (BENADRYL ) 25 MG tablet Take 1 tablet (25 mg total) by mouth every 6 (six) hours as needed for up to 7 days. 04/10/24 05/30/24  Soto, Johana, PA-C  predniSONE  (DELTASONE ) 20 MG tablet Take 2 tablets (40 mg total) by mouth daily for 5 days. 09/26/24 10/01/24  Reddick, Johnathan B, NP  triamcinolone  cream (KENALOG ) 0.1 % Apply 1 Application topically 2 (two) times daily. 03/28/23   Enedelia Dorna HERO, FNP    Family History Family History  Problem Relation Age of Onset   Cancer Maternal Aunt        Breast. diagnosed young   Cancer Maternal Grandmother        Breast   Lupus Brother     Social History Social History[1]   Allergies   Ibuprofen    Review of Systems Review of Systems  Constitutional:  Negative for activity change, appetite change, chills and fever.  HENT:  Positive for congestion and sore throat. Negative for ear pain.   Eyes:  Positive for photophobia. Negative for visual disturbance.  Respiratory:  Positive for cough. Negative for shortness of breath.   Cardiovascular:  Negative for chest pain.  Gastrointestinal:  Positive for nausea. Negative for abdominal pain, diarrhea and vomiting.  Skin:  Negative for color change and rash.  Neurological:  Positive for headaches. Negative for dizziness.  All other systems reviewed and are negative.    Physical Exam Triage Vital Signs ED Triage Vitals  Encounter Vitals Group     BP 10/01/24 2058 124/78     Girls Systolic BP Percentile --      Girls Diastolic BP Percentile --      Boys Systolic BP Percentile --      Boys Diastolic BP Percentile --      Pulse Rate 10/01/24 2058  85     Resp 10/01/24 2058 16     Temp 10/01/24 2058 99.1 F (37.3 C)     Temp Source 10/01/24 2058 Oral     SpO2 10/01/24 2058 98 %     Weight --      Height --      Head Circumference --      Peak Flow --      Pain Score 10/01/24 2057 8     Pain Loc --      Pain Education --      Exclude from Growth Chart --    No data found.  Updated Vital Signs BP 124/78 (BP Location: Right Arm)   Pulse 85   Temp 99.1 F (37.3 C) (Oral)   Resp 16   LMP 08/28/2024 (Approximate)   SpO2 98%   Visual Acuity Right Eye Distance:   Left Eye Distance:   Bilateral Distance:    Right Eye Near:   Left Eye Near:    Bilateral Near:     Physical Exam Vitals and nursing note reviewed.  Constitutional:      General: She is not in acute distress.    Appearance: She is well-developed. She is not ill-appearing or toxic-appearing.     Comments: Female appearing stated age found sitting in chair in no acute distress.  HENT:     Head: Normocephalic and atraumatic.     Right Ear: Tympanic membrane and external ear normal.     Left Ear: Tympanic membrane and external ear normal.     Nose: Congestion present.     Mouth/Throat:     Lips: Pink.     Mouth: Mucous membranes are moist.     Pharynx: No oropharyngeal exudate or posterior oropharyngeal erythema.  Eyes:     Extraocular Movements: Extraocular movements intact.     Conjunctiva/sclera: Conjunctivae normal.  Cardiovascular:     Rate and Rhythm: Normal rate and regular rhythm.     Heart sounds: Normal heart sounds. No murmur heard. Pulmonary:     Effort: Pulmonary effort is normal. No respiratory distress.     Breath sounds: Normal breath sounds.  Abdominal:     General: Bowel sounds are normal.     Palpations: Abdomen is soft.     Tenderness: There is no abdominal tenderness.  Musculoskeletal:     Cervical back: Neck supple.  Skin:    General: Skin is warm and dry.     Capillary Refill: Capillary refill takes less than 2 seconds.   Neurological:     Mental Status: She is alert and oriented to  person, place, and time.  Psychiatric:        Mood and Affect: Mood normal.      UC Treatments / Results  Labs (all labs ordered are listed, but only abnormal results are displayed) Labs Reviewed  POC SOFIA SARS ANTIGEN FIA  POCT INFLUENZA A/B    EKG   Radiology No results found.  Procedures Procedures (including critical care time)  Medications Ordered in UC Medications  metoCLOPramide  (REGLAN ) injection 5 mg (5 mg Intramuscular Given 10/01/24 2132)  dexamethasone  (DECADRON ) injection 10 mg (10 mg Intramuscular Given 10/01/24 2132)  acetaminophen  (TYLENOL ) tablet 650 mg (650 mg Oral Given 10/01/24 2132)    Initial Impression / Assessment and Plan / UC Course  I have reviewed the triage vital signs and the nursing notes.  Pertinent labs & imaging results that were available during my care of the patient were reviewed by me and considered in my medical decision making (see chart for details).     Vitals and triage reviewed, patient is hemodynamically stable.  COVID and flu swabs collected and are negative.  She was given dexamethasone , Reglan , Tylenol  in clinic for headache cocktail.  She reports improvement in headache.  Plan of care, follow-up care, return precautions given, no questions at this time.  She is provided with a work note. Final Clinical Impressions(s) / UC Diagnoses   Final diagnoses:  Acute cough  Viral illness  Acute nonintractable headache, unspecified headache type     Discharge Instructions      Your COVID and flu swabs are negative.   You were treated for headache today.  You received dexamethasone , Reglan , Tylenol  in clinic.  For headache at home, take Tylenol  as needed. Apply ice pack to head or back of neck, on for 20 minutes and off for 20 minutes for headache.  If you develop new or worsening symptoms or if your symptoms do not start to improve, return here or follow-up  with your primary care provider.  If your symptoms are severe, please go to the emergency room.  If needed, follow-up with neurology.  Information below: [facility] [address] [place]  [address] [place], [place]  [address] [phone]     ED Prescriptions   None    PDMP not reviewed this encounter.    [1]  Social History Tobacco Use   Smoking status: Every Day    Types: E-cigarettes   Smokeless tobacco: Never   Tobacco comments:    5 cigs/d  Vaping Use   Vaping status: Every Day  Substance Use Topics   Alcohol use: Yes    Alcohol/week: 5.0 standard drinks of alcohol    Types: 5 Shots of liquor per week    Comment: Occasionally   Drug use: Yes    Frequency: 3.0 times per week    Types: Marijuana     Lennice Jon BROCKS, FNP 10/01/24 2156  "

## 2024-10-01 NOTE — ED Notes (Signed)
 Reviewed work note
